# Patient Record
Sex: Female | Born: 1955 | Race: White | Hispanic: No | Marital: Married | Smoking: Never smoker
Health system: Southern US, Community
[De-identification: ages and names within clinical notes are randomized; demographics above are authoritative.]

## PROBLEM LIST (undated history)

## (undated) DIAGNOSIS — C7A8 Other malignant neuroendocrine tumors: Secondary | ICD-10-CM

## (undated) DIAGNOSIS — R1310 Dysphagia, unspecified: Secondary | ICD-10-CM

## (undated) DIAGNOSIS — G250 Essential tremor: Secondary | ICD-10-CM

## (undated) DIAGNOSIS — N809 Endometriosis, unspecified: Secondary | ICD-10-CM

## (undated) DIAGNOSIS — M8580 Other specified disorders of bone density and structure, unspecified site: Secondary | ICD-10-CM

## (undated) DIAGNOSIS — E785 Hyperlipidemia, unspecified: Secondary | ICD-10-CM

## (undated) DIAGNOSIS — M810 Age-related osteoporosis without current pathological fracture: Secondary | ICD-10-CM

## (undated) DIAGNOSIS — I10 Essential (primary) hypertension: Secondary | ICD-10-CM

## (undated) DIAGNOSIS — F32A Depression, unspecified: Secondary | ICD-10-CM

## (undated) DIAGNOSIS — Z9189 Other specified personal risk factors, not elsewhere classified: Secondary | ICD-10-CM

## (undated) DIAGNOSIS — R7303 Prediabetes: Secondary | ICD-10-CM

## (undated) DIAGNOSIS — M1990 Unspecified osteoarthritis, unspecified site: Secondary | ICD-10-CM

## (undated) DIAGNOSIS — E669 Obesity, unspecified: Secondary | ICD-10-CM

## (undated) DIAGNOSIS — E119 Type 2 diabetes mellitus without complications: Secondary | ICD-10-CM

## (undated) DIAGNOSIS — Z91B Personal risk factor of exposure to diethylstilbestrol: Secondary | ICD-10-CM

## (undated) DIAGNOSIS — K5730 Diverticulosis of large intestine without perforation or abscess without bleeding: Secondary | ICD-10-CM

## (undated) DIAGNOSIS — T7840XA Allergy, unspecified, initial encounter: Secondary | ICD-10-CM

## (undated) DIAGNOSIS — K219 Gastro-esophageal reflux disease without esophagitis: Secondary | ICD-10-CM

## (undated) HISTORY — DX: Endometriosis, unspecified: N80.9

## (undated) HISTORY — DX: Other malignant neuroendocrine tumors: C7A.8

## (undated) HISTORY — DX: Allergy, unspecified, initial encounter: T78.40XA

## (undated) HISTORY — DX: Essential (primary) hypertension: I10

## (undated) HISTORY — DX: Obesity, unspecified: E66.9

## (undated) HISTORY — DX: Diverticulosis of large intestine without perforation or abscess without bleeding: K57.30

## (undated) HISTORY — DX: Age-related osteoporosis without current pathological fracture: M81.0

## (undated) HISTORY — DX: Essential tremor: G25.0

## (undated) HISTORY — DX: Dysphagia, unspecified: R13.10

## (undated) HISTORY — DX: Type 2 diabetes mellitus without complications: E11.9

## (undated) HISTORY — DX: Gastro-esophageal reflux disease without esophagitis: K21.9

## (undated) HISTORY — DX: Other specified disorders of bone density and structure, unspecified site: M85.80

## (undated) HISTORY — PX: OOPHORECTOMY: SHX86

## (undated) HISTORY — DX: Unspecified osteoarthritis, unspecified site: M19.90

## (undated) HISTORY — DX: Prediabetes: R73.03

## (undated) HISTORY — DX: Hyperlipidemia, unspecified: E78.5

## (undated) HISTORY — DX: Depression, unspecified: F32.A

## (undated) HISTORY — PX: BUNIONECTOMY: SHX129

## (undated) HISTORY — DX: Personal risk factor of exposure to diethylstilbestrol: Z91.B

## (undated) HISTORY — DX: Other specified personal risk factors, not elsewhere classified: Z91.89

---

## 2005-07-15 DIAGNOSIS — D3A00 Benign carcinoid tumor of unspecified site: Secondary | ICD-10-CM

## 2005-07-15 DIAGNOSIS — D3A019 Benign carcinoid tumor of the small intestine, unspecified portion: Secondary | ICD-10-CM

## 2005-07-15 HISTORY — DX: Benign carcinoid tumor of the small intestine, unspecified portion: D3A.019

## 2005-07-15 HISTORY — PX: TOTAL ABDOMINAL HYSTERECTOMY: SHX209

## 2005-07-15 HISTORY — DX: Benign carcinoid tumor of unspecified site: D3A.00

## 2005-11-12 HISTORY — PX: APPENDECTOMY: SHX54

## 2017-05-15 DIAGNOSIS — Z860101 Personal history of adenomatous and serrated colon polyps: Secondary | ICD-10-CM

## 2017-05-15 DIAGNOSIS — Z86010 Personal history of colonic polyps: Secondary | ICD-10-CM

## 2017-05-15 HISTORY — DX: Personal history of adenomatous and serrated colon polyps: Z86.0101

## 2017-05-15 HISTORY — DX: Personal history of colonic polyps: Z86.010

## 2019-08-05 ENCOUNTER — Ambulatory Visit: Payer: Medicare Other | Attending: Internal Medicine

## 2019-08-05 DIAGNOSIS — Z23 Encounter for immunization: Secondary | ICD-10-CM

## 2019-08-05 NOTE — Progress Notes (Signed)
   Covid-19 Vaccination Clinic  Name:  Lauren [name]    MRN: [id number] DOB: [birth year]  08/05/2019  Lauren [name] was observed post Covid-19 immunization for 15 minutes without incidence. She was provided with Vaccine Information Sheet and instruction to access the V-Safe system.   Lauren [name] was instructed to call 911 with any severe reactions post vaccine: Marland Kitchen Difficulty breathing  . Swelling of your face and throat  . A fast heartbeat  . A bad rash all over your body  . Dizziness and weakness    Immunizations Administered    Name Date Dose VIS Date Route   Pfizer COVID-19 Vaccine 08/05/2019 10:45 AM 0.3 mL 06/25/2019 Intramuscular   Manufacturer: [place]   Lot: [id number]   [place]: [id number]

## 2019-08-26 ENCOUNTER — Ambulatory Visit: Payer: Medicare Other | Attending: Internal Medicine

## 2019-08-26 DIAGNOSIS — Z23 Encounter for immunization: Secondary | ICD-10-CM | POA: Insufficient documentation

## 2019-08-26 NOTE — Progress Notes (Signed)
   Covid-19 Vaccination Clinic  Name:  Lauren [name]    MRN: [id number] DOB: Dec 26, 1955  08/26/2019  Lauren [name] was observed post Covid-19 immunization for 15 minutes without incidence. She was provided with Vaccine Information Sheet and instruction to access the V-Safe system.   Lauren [name] was instructed to call 911 with any severe reactions post vaccine: Marland Kitchen Difficulty breathing  . Swelling of your face and throat  . A fast heartbeat  . A bad rash all over your body  . Dizziness and weakness    Immunizations Administered    Name Date Dose VIS Date Route   Pfizer COVID-19 Vaccine 08/26/2019 11:24 AM 0.3 mL 06/25/2019 Intramuscular   Manufacturer: [place]   Lot: [id number]   [place]: [id number]

## 2019-09-07 ENCOUNTER — Ambulatory Visit: Payer: Self-pay

## 2019-12-31 ENCOUNTER — Encounter: Payer: Self-pay | Admitting: Cardiology

## 2019-12-31 ENCOUNTER — Ambulatory Visit: Payer: Medicare Other | Admitting: Cardiology

## 2019-12-31 ENCOUNTER — Other Ambulatory Visit: Payer: Self-pay

## 2019-12-31 VITALS — BP 125/87 | HR 96 | Temp 97.0°F | Resp 14 | Ht 65.0 in | Wt 203.4 lb

## 2019-12-31 DIAGNOSIS — E782 Mixed hyperlipidemia: Secondary | ICD-10-CM

## 2019-12-31 DIAGNOSIS — Z6833 Body mass index (BMI) 33.0-33.9, adult: Secondary | ICD-10-CM

## 2019-12-31 DIAGNOSIS — R030 Elevated blood-pressure reading, without diagnosis of hypertension: Secondary | ICD-10-CM

## 2019-12-31 DIAGNOSIS — R7303 Prediabetes: Secondary | ICD-10-CM

## 2019-12-31 DIAGNOSIS — R002 Palpitations: Secondary | ICD-10-CM

## 2019-12-31 DIAGNOSIS — E6609 Other obesity due to excess calories: Secondary | ICD-10-CM

## 2019-12-31 DIAGNOSIS — R0602 Shortness of breath: Secondary | ICD-10-CM

## 2019-12-31 NOTE — Progress Notes (Signed)
Date:  12/31/2019   ID:  Lauren [name], [place] 28-Nov-1955, MRN [id number]  PCP:  Kathee Polite, MD  Cardiologist:  Rex Kras, DO, [facility] (established care 12/31/2019)  REASON FOR CONSULT: Shortness of Breath  REQUESTING PHYSICIAN:  Lynnae Prude, [place] [address] [place],  [place] [address]  Chief Complaint  Patient presents with  . Shortness of Breath  . New Patient (Initial Visit)    Referred by Lynnae Prude    HPI  Lauren [name] is a 64 y.o. female who presents to the office with a chief complaint of " shortness of breath, elevated blood pressures, elevated heart rate." She is referred to the office at the request of Lynnae Prude, [place]. Patient's past medical history and cardiovascular risk factors include: Prediabetes, mixed hyperlipidemia, obesity due to excess calories, postmenopausal female, advanced age.  Patient presents to the office with multiple chief complaint as noted above.  Patient states her shortness of breath started in May 2020 after she started working out at [employer].  The symptoms are usually brought on with effort related activities and improves with resting.  Associated with lower extremity swelling.  She denies orthopnea or paroxysmal nocturnal dyspnea.  No recent prolonged immobilization or recent surgeries.  No prior history of DVT or PE.  Patient states that she used to take Lasix on a as needed basis.  However, recently she has been taking it regularly.  proBNP within normal limits.  Patient denies any chest pain at rest or with effort related activities.  Palpations: Ongoing for approximately 1 year but notes that recently it has been getting progressive.  Using intermittent lasting for 5 minutes and occurs daily.  Associated with lightheadedness but no dizziness or syncope.  At times when she checks her blood pressures at home patient states that they have been elevated.  She did not have a log of her blood pressures for me to review at  today's office visit.  Office blood pressure currently well controlled.   Denies prior history of coronary artery disease, myocardial infarction, congestive heart failure, deep venous thrombosis, pulmonary embolism, stroke, transient ischemic attack.  ALLERGIES: Allergies  Allergen Reactions  . Codeine Itching  . Sulfa Antibiotics Diarrhea and Nausea And Vomiting    MEDICATION LIST PRIOR TO VISIT: Current Meds  Medication Sig  . buPROPion (WELLBUTRIN XL) 150 MG 24 hr tablet Take 1 tablet by mouth daily.  . cetirizine (ZYRTEC) 10 MG tablet Take 1 tablet by mouth at bedtime.  . Cholecalciferol 25 MCG (1000 UT) capsule Take 1 capsule by mouth daily.  . furosemide (LASIX) 20 MG tablet Take 1 tablet by mouth as needed.  . Magnesium (V-R MAGNESIUM) 250 MG TABS Take 1 tablet by mouth daily.  . metFORMIN (GLUCOPHAGE-XR) 500 MG 24 hr tablet Take 2 tablets by mouth daily.     PAST MEDICAL HISTORY: Past Medical History:  Diagnosis Date  . Carcinoid tumor 2007  . Depression   . Hyperlipidemia   . Obesity   . Prediabetes     PAST SURGICAL HISTORY: Past Surgical History:  Procedure Laterality Date  . TOTAL ABDOMINAL HYSTERECTOMY  2007    FAMILY HISTORY: The patient family history includes Bone cancer in her father; Brain cancer in her father; Lung cancer in her mother.  SOCIAL HISTORY:  The patient  reports that she has never smoked. She has never used smokeless tobacco. She reports that she does not drink alcohol and does not use drugs.  REVIEW OF SYSTEMS:  Review of Systems  Constitutional: Positive for malaise/fatigue. Negative for chills and fever.  HENT: Negative for hoarse voice and nosebleeds.   Eyes: Negative for discharge, double vision and pain.  Cardiovascular: Positive for dyspnea on exertion, leg swelling and palpitations. Negative for chest pain, claudication, near-syncope, orthopnea, paroxysmal nocturnal dyspnea and syncope.  Respiratory: Positive for shortness of  breath. Negative for hemoptysis.   Musculoskeletal: Negative for muscle cramps and myalgias.  Gastrointestinal: Negative for abdominal pain, constipation, diarrhea, hematemesis, hematochezia, melena, nausea and vomiting.  Neurological: Negative for dizziness and light-headedness.    PHYSICAL EXAM: Vitals with BMI 12/31/2019  Height '5\' 5"'   Weight 203 lbs 6 oz  BMI 03.21  Systolic 224  Diastolic 87  Pulse 96   CONSTITUTIONAL: Well-developed and well-nourished. No acute distress.  SKIN: Skin is warm and dry. No rash noted. No cyanosis. No pallor. No jaundice HEAD: Normocephalic and atraumatic.  EYES: No scleral icterus MOUTH/THROAT: Moist oral membranes.  NECK: No JVD present. No thyromegaly noted. No carotid bruits  LYMPHATIC: No visible cervical adenopathy.  CHEST Normal respiratory effort. No intercostal retractions  LUNGS: Clear to auscultation bilaterally.  No stridor. No wheezes. No rales.  CARDIOVASCULAR: Regular rate and rhythm, positive M2-N0, soft holosystolic murmur heard at apex, no gallops or rubs. ABDOMINAL: No apparent ascites.  EXTREMITIES: No peripheral edema  HEMATOLOGIC: No significant bruising NEUROLOGIC: Oriented to person, place, and time. Nonfocal. Normal muscle tone.  PSYCHIATRIC: Normal mood and affect. Normal behavior. Cooperative  CARDIAC DATABASE: EKG: 12/31/2019: Normal sinus rhythm, 92 bpm, normal axis, consider old inferior infarct, pattern.  Echocardiogram: none  Stress Testing: None  Heart Catheterization: None  LABORATORY DATA:  External Labs: Collected: 12/30/2019 Hemoglobin 14.6 g/dL Creatinine 0.73 mg/dL. eGFR: 86 mL/min per 1.73 m Potassium: 4.2 Lipid profile: Total cholesterol 276, triglycerides 180, HDL 74, LDL 167 TSH: 3.485  ProBNP: 18pg/mL  IMPRESSION:    ICD-10-CM   1. Shortness of breath  R06.02 EKG 12-Lead    CT CARDIAC SCORING    PCV ECHOCARDIOGRAM COMPLETE    PCV MYOCARDIAL PERFUSION WITH LEXISCAN  2. Blood  pressure elevated without history of HTN  R03.0   3. Palpitations  R00.2 HOLTER MONITOR - 24 HOUR  4. Class 1 obesity due to excess calories without serious comorbidity with body mass index (BMI) of 33.0 to 33.9 in adult  E66.09    Z68.33   5. Prediabetes  R73.03 CT CARDIAC SCORING  6. Mixed hyperlipidemia  E78.2 CT CARDIAC SCORING     RECOMMENDATIONS: Izabel Chim is a 64 y.o. female whose past medical history and cardiac risk factors include: Prediabetes, mixed hyperlipidemia, obesity due to excess calories, postmenopausal female, advanced age.  Shortness of breath:  Plan 24-hour Holter monitor to evaluate underlying arrhythmic burden.  Echocardiogram will be ordered to evaluate for structural heart disease and left ventricular systolic function.  Nuclear stress test recommended to evaluate for reversible ischemia.  EKG reviewed.  Clinically patient is euvolemic and not in congestive heart failure.  Elevated blood pressures without history of hypertension: Patient states that her blood pressures are elevated at home.  She recommended to keep a log of her blood pressures and to bring it in at the next office visit.  However, if her systolic blood pressures are consistently greater than 140 mmHg she is asked to call the office sooner than her next appointment for medication titration.  Low-salt diet recommended.  Recommended moderate intensity exercise 30 minutes a day 5 days a week  Palpitations:  TSH levels reviewed, mildly above the normal limit.  Patient is asked discuss it further with her primary care provider.  24-hour Holter monitor to evaluate for underlying arrhythmic burden/atrial fibrillation.  Prediabetes and mixed hyperlipidemia:  Educated on importance of risk factor modifications given her modifiable cardiovascular risk factors.  Patient's estimated 10-year risk of ASCVD is approximately 6.2% as per the available information.  Coronary calcification score  for further risk stratification.  Patient hesitant to start statin therapy for now.  Recommended moderate intensity exercise 30 minutes a day 5 days a week.   Independently reviewed outside records that she brought in today's office visit.  FINAL MEDICATION LIST END OF ENCOUNTER: No orders of the defined types were placed in this encounter.   There are no discontinued medications.   Current Outpatient Medications:  .  buPROPion (WELLBUTRIN XL) 150 MG 24 hr tablet, Take 1 tablet by mouth daily., Disp: , Rfl:  .  cetirizine (ZYRTEC) 10 MG tablet, Take 1 tablet by mouth at bedtime., Disp: , Rfl:  .  Cholecalciferol 25 MCG (1000 UT) capsule, Take 1 capsule by mouth daily., Disp: , Rfl:  .  furosemide (LASIX) 20 MG tablet, Take 1 tablet by mouth as needed., Disp: , Rfl:  .  Magnesium (V-R MAGNESIUM) 250 MG TABS, Take 1 tablet by mouth daily., Disp: , Rfl:  .  metFORMIN (GLUCOPHAGE-XR) 500 MG 24 hr tablet, Take 2 tablets by mouth daily., Disp: , Rfl:   Orders Placed This Encounter  Procedures  . CT CARDIAC SCORING  . PCV MYOCARDIAL PERFUSION WITH LEXISCAN  . HOLTER MONITOR - 24 HOUR  . EKG 12-Lead  . PCV ECHOCARDIOGRAM COMPLETE    There are no Patient Instructions on file for this visit.   --Continue cardiac medications as reconciled in final medication list. --Return in about 4 weeks (around 01/28/2020) for re-evaluation of symptoms shortness of breath and review test results . Or sooner if needed. --Continue follow-up with your primary care physician regarding the management of your other chronic comorbid conditions.  Patient's questions and concerns were addressed to her satisfaction. She voices understanding of the instructions provided during this encounter.   This note was created using a voice recognition software as a result there may be grammatical errors inadvertently enclosed that do not reflect the nature of this encounter. Every attempt is made to correct such  errors.  Rex Kras, [place], [facility]  Pager: [phone] Office: [phone]

## 2020-01-06 ENCOUNTER — Ambulatory Visit: Payer: Medicare Other

## 2020-01-06 ENCOUNTER — Other Ambulatory Visit: Payer: Self-pay

## 2020-01-06 DIAGNOSIS — R0602 Shortness of breath: Secondary | ICD-10-CM

## 2020-01-11 ENCOUNTER — Other Ambulatory Visit: Payer: Self-pay

## 2020-01-11 ENCOUNTER — Ambulatory Visit: Payer: Medicare Other

## 2020-01-14 ENCOUNTER — Telehealth: Payer: Self-pay

## 2020-01-14 NOTE — Telephone Encounter (Signed)
Called pt and is aware of her results and inform her that it is ok for her to go out of town

## 2020-01-14 NOTE — Telephone Encounter (Signed)
Pt called and asking if she is able to go home due that her mother has passed away.

## 2020-01-14 NOTE — Telephone Encounter (Signed)
Echocardiogram results noted preserved LVEF.  Details will be reviewed at the next office visit.  And coronary calcium score is 0 and other additional details will be reviewed at the next office visit.

## 2020-01-14 NOTE — Progress Notes (Signed)
Coronary artery calcification scoring performed on 01/05/2020 at [facility]: Total calcium score: 0 AU Incidental finding of a 29 mm left lobe liver cyst.  Will inform the patient at next office visit.  Will defer additional work-up to her primary care provider.

## 2020-01-24 ENCOUNTER — Other Ambulatory Visit: Payer: Self-pay

## 2020-01-24 ENCOUNTER — Ambulatory Visit: Payer: Medicare Other

## 2020-01-24 DIAGNOSIS — R0602 Shortness of breath: Secondary | ICD-10-CM

## 2020-02-01 ENCOUNTER — Encounter: Payer: Self-pay | Admitting: Cardiology

## 2020-02-01 ENCOUNTER — Other Ambulatory Visit: Payer: Self-pay

## 2020-02-01 ENCOUNTER — Ambulatory Visit: Payer: Medicare Other | Admitting: Cardiology

## 2020-02-01 VITALS — BP 128/96 | HR 95 | Ht 65.0 in | Wt 202.0 lb

## 2020-02-01 DIAGNOSIS — R0609 Other forms of dyspnea: Secondary | ICD-10-CM

## 2020-02-01 DIAGNOSIS — I10 Essential (primary) hypertension: Secondary | ICD-10-CM

## 2020-02-01 DIAGNOSIS — Z712 Person consulting for explanation of examination or test findings: Secondary | ICD-10-CM

## 2020-02-01 DIAGNOSIS — R7303 Prediabetes: Secondary | ICD-10-CM

## 2020-02-01 DIAGNOSIS — E6609 Other obesity due to excess calories: Secondary | ICD-10-CM

## 2020-02-01 DIAGNOSIS — R0600 Dyspnea, unspecified: Secondary | ICD-10-CM

## 2020-02-01 DIAGNOSIS — Z6833 Body mass index (BMI) 33.0-33.9, adult: Secondary | ICD-10-CM

## 2020-02-01 DIAGNOSIS — R002 Palpitations: Secondary | ICD-10-CM

## 2020-02-01 MED ORDER — DILTIAZEM HCL ER COATED BEADS 120 MG PO CP24: 120.0000 mg | ORAL_CAPSULE | Freq: Every morning | ORAL | 0 refills | Status: DC

## 2020-02-01 NOTE — Progress Notes (Signed)
Date:  02/01/2020   ID:  Lauren [name], Lauren [name] 1956/04/25, MRN [id number]  PCP:  Kathee Polite, MD  Cardiologist:  Rex Kras, DO, [facility] (established care 12/31/2019)  REASON FOR CONSULT: Shortness of Breath  REQUESTING PHYSICIAN:  Kathee Polite, MD [address],  [place] [address]  Chief Complaint  Patient presents with  . Shortness of Breath  . Follow-up  . Palpitations    HPI  Lauren [name] is a 64 y.o. female who presents to the office with a chief complaint of " reevaluation of shortness of breath/palpitations and review test results." Patient's past medical history and cardiovascular risk factors include: Prediabetes, mixed hyperlipidemia, obesity due to excess calories, postmenopausal female, advanced age.  Patient is referred to the office at the request of her management of shortness of breath back in June 2021.  At the last office visit she was having symptoms of dyspnea on exertion as well as palpitation. She was recommended to undergo echo, exercise stress test, and coronary calcification score for risk stratification given her lipid profile. She was also asked to keep a log off her BP and pulse.   Patient is informed that her echocardiogram noted preserved left ventricular systolic function with mild valvular heart disease.  Reviewed the results of her stress test which noted normal myocardial perfusion suggestive of a low risk study.  Her coronary artery calcification score was also zero.  Her monitor results noted no significant dysrhythmias.   She was made aware of the noncardiac findings of a 29 mm left lobe liver cyst that was noted on the coronary calcification study.  Patient states that she is familiar with the fact she has liver cysts and this is most likely secondary to her history of carcinoid tumor which is being followed by oncology.  In addition, patient did bring in her blood pressure log to review.  Her home systolic  blood pressures have consistently been greater than 120 mmHg and her diastolic blood pressures are around 80 mmHg.  Her pulse at home range between 990-110 bpm.    Monitor results did not show any significant dysrhythmias at her baseline average heart rate is approximately 91 bpm.  Denies prior history of coronary artery disease, myocardial infarction, congestive heart failure, deep venous thrombosis, pulmonary embolism, stroke, transient ischemic attack.  ALLERGIES: Allergies  Allergen Reactions  . Codeine Itching  . Sulfa Antibiotics Diarrhea and Nausea And Vomiting    MEDICATION LIST PRIOR TO VISIT: Current Meds  Medication Sig  . buPROPion (WELLBUTRIN XL) 150 MG 24 hr tablet Take 1 tablet by mouth daily.  . cetirizine (ZYRTEC) 10 MG tablet Take 1 tablet by mouth at bedtime.  . Cholecalciferol 25 MCG (1000 UT) capsule Take 1 capsule by mouth daily.  . furosemide (LASIX) 20 MG tablet Take 1 tablet by mouth as needed.  . Magnesium (V-R MAGNESIUM) 250 MG TABS Take 1 tablet by mouth daily.  . metFORMIN (GLUCOPHAGE-XR) 500 MG 24 hr tablet Take 2 tablets by mouth daily.  . Probiotic Product (PRO-BIOTIC BLEND) CAPS Take 1 capsule by mouth in the morning and at bedtime.     PAST MEDICAL HISTORY: Past Medical History:  Diagnosis Date  . Carcinoid tumor 2007  . Depression   . Hyperlipidemia   . Obesity   . Prediabetes     PAST SURGICAL HISTORY: Past Surgical History:  Procedure Laterality Date  . TOTAL ABDOMINAL HYSTERECTOMY  2007    FAMILY HISTORY: The patient  family history includes Bone cancer in her father; Brain cancer in her father; Lung cancer in her mother.  SOCIAL HISTORY:  The patient  reports that she has never smoked. She has never used smokeless tobacco. She reports that she does not drink alcohol and does not use drugs.  REVIEW OF SYSTEMS: Review of Systems  Constitutional: Positive for malaise/fatigue. Negative for chills and fever.  HENT: Negative for hoarse  voice and nosebleeds.   Eyes: Negative for discharge, double vision and pain.  Cardiovascular: Positive for dyspnea on exertion, leg swelling and palpitations. Negative for chest pain, claudication, near-syncope, orthopnea, paroxysmal nocturnal dyspnea and syncope.  Respiratory: Positive for shortness of breath. Negative for hemoptysis.   Musculoskeletal: Negative for muscle cramps and myalgias.  Gastrointestinal: Negative for abdominal pain, constipation, diarrhea, hematemesis, hematochezia, melena, nausea and vomiting.  Neurological: Negative for dizziness and light-headedness.    PHYSICAL EXAM: Vitals with BMI 02/01/2020 12/31/2019  Height '5\' 5"'  '5\' 5"'   Weight 202 lbs 203 lbs 6 oz  BMI 38.18 29.93  Systolic 716 967  Diastolic 96 87  Pulse 95 96   CONSTITUTIONAL: Well-developed and well-nourished. No acute distress.  SKIN: Skin is warm and dry. No rash noted. No cyanosis. No pallor. No jaundice HEAD: Normocephalic and atraumatic.  EYES: No scleral icterus MOUTH/THROAT: Moist oral membranes.  NECK: No JVD present. No thyromegaly noted. No carotid bruits  LYMPHATIC: No visible cervical adenopathy.  CHEST Normal respiratory effort. No intercostal retractions  LUNGS: Clear to auscultation bilaterally.  No stridor. No wheezes. No rales.  CARDIOVASCULAR: Regular rate and rhythm, positive E9-F8, soft holosystolic murmur heard at apex, no gallops or rubs. ABDOMINAL: No apparent ascites.  EXTREMITIES: No peripheral edema  HEMATOLOGIC: No significant bruising NEUROLOGIC: Oriented to person, place, and time. Nonfocal. Normal muscle tone.  PSYCHIATRIC: Normal mood and affect. Normal behavior. Cooperative  CARDIAC DATABASE: EKG: 12/31/2019: Normal sinus rhythm, 92 bpm, normal axis, consider old inferior infarct, pattern.  Echocardiogram: 01/06/2020:  Normal LV systolic function with visual EF 55-60%. Left ventricle cavity is normal in size. Mild left ventricular hypertrophy. Normal global  wall motion. Indeterminate diastolic filling pattern, normal LAP. Calculated EF 55%.  Left atrial cavity is mildly dilated.  Mild (Grade I) mitral regurgitation.  IVC is dilated with a respiratory response of <50%.  No prior study for comparison.   Stress Testing: Exercise Myoview stress test 01/24/2020:  Exercise nuclear stress test was performed using Bruce protocol. Patient reached 7 METS, and 96% of age predicted maximum heart rate. Exercise capacity was low. Chest pain not reported. Heart rate and hemodynamic response were normal. Stress EKG revealed no ischemic changes.  Normal myocardial perfusion. Stress LVEF 67%. Low risk study,  Coronary artery calcification scoring performed on 01/05/2020 at [facility]:  Total calcium score: 0 AU  Incidental finding of a 29 mm left lobe liver cyst. Will inform the patient at next office visit. Will defer additional work-up to her primary care provider.  Heart Catheterization: None  LABORATORY DATA:  External Labs: Collected: 12/30/2019 Hemoglobin 14.6 g/dL Creatinine 0.73 mg/dL. eGFR: 86 mL/min per 1.73 m Potassium: 4.2 Lipid profile: Total cholesterol 276, triglycerides 180, HDL 74, LDL 167 TSH: 3.485  ProBNP: 18pg/mL  IMPRESSION:    ICD-10-CM   1. Palpitations  R00.2 diltiazem (CARDIZEM CD) 120 MG 24 hr capsule  2. Encounter to discuss test results  Z71.2   3. Benign hypertension  I10   4. Dyspnea on exertion  R06.00   5. Prediabetes  R73.03  6. Class 1 obesity due to excess calories without serious comorbidity with body mass index (BMI) of 33.0 to 33.9 in adult  E66.09    Z68.33      RECOMMENDATIONS: Ermina Oberman is a 64 y.o. female whose past medical history and cardiac risk factors include: Prediabetes, mixed hyperlipidemia, obesity due to excess calories, postmenopausal female, advanced age.  Dyspnea on exertion: Chronic and stable  Given her symptoms of dyspnea on exertion patient underwent a comprehensive  cardiovascular work-up including but not limited to echo, stress test, coronary artery calcification scoring, and Holter monitor.    Results were discussed with her the risk mentioned..    Patient's blood pressure is not well controlled which may also be contributing to her dyspnea on exertion.  Patient states that she has an appointment to see her primary care provider later today who will address her blood pressures.    Continue to monitor for now.    Benign essential hypertension: After reviewing her home blood pressure logs patient does have benign essential hypertension.  Recommended antihypertensive medications which she will discuss with her PCP later today.  Palpitations:  TSH levels reviewed, mildly above the normal limit.  Patient is asked discuss it further with her primary care provider.  24-hour Holter monitor reviewed with the patient.  Final report forthcoming  Given his symptoms of palpitations without any significant underlying arrhythmia and her baseline pulse being elevated recommended trial pharmacological therapy.  Start Cardizem 120 mg p.o. daily  We will reevaluate.  Prediabetes and mixed hyperlipidemia:  Educated on importance of risk factor modifications given her modifiable cardiovascular risk factors.  Patient's estimated 10-year risk of ASCVD is approximately 6.2% as per the available information.  Coronary calcification score is zero.   Recommended moderate intensity exercise 30 minutes a day 5 days a week.  With close follow-up regarding lipid management with her PCP as her last LDL was 167 mg/dL.  Patient educated on importance of improving her cardiovascular risk factors for primary prevention of CAD.  Patient is informed of the noncardiac findings noted on coronary CTA study.  Patient states that she has history of carcinoid tumors and liver cyst are not a new finding.  Both of them are currently being managed by her oncology, per patient  FINAL  MEDICATION LIST END OF ENCOUNTER: Meds ordered this encounter  Medications  . diltiazem (CARDIZEM CD) 120 MG 24 hr capsule    Sig: Take 1 capsule (120 mg total) by mouth in the morning.    Dispense:  90 capsule    Refill:  0    There are no discontinued medications.   Current Outpatient Medications:  .  buPROPion (WELLBUTRIN XL) 150 MG 24 hr tablet, Take 1 tablet by mouth daily., Disp: , Rfl:  .  cetirizine (ZYRTEC) 10 MG tablet, Take 1 tablet by mouth at bedtime., Disp: , Rfl:  .  Cholecalciferol 25 MCG (1000 UT) capsule, Take 1 capsule by mouth daily., Disp: , Rfl:  .  furosemide (LASIX) 20 MG tablet, Take 1 tablet by mouth as needed., Disp: , Rfl:  .  Magnesium (V-R MAGNESIUM) 250 MG TABS, Take 1 tablet by mouth daily., Disp: , Rfl:  .  metFORMIN (GLUCOPHAGE-XR) 500 MG 24 hr tablet, Take 2 tablets by mouth daily., Disp: , Rfl:  .  Probiotic Product (PRO-BIOTIC BLEND) CAPS, Take 1 capsule by mouth in the morning and at bedtime., Disp: , Rfl:  .  diltiazem (CARDIZEM CD) 120 MG 24 hr capsule,  Take 1 capsule (120 mg total) by mouth in the morning., Disp: 90 capsule, Rfl: 0  No orders of the defined types were placed in this encounter.   There are no Patient Instructions on file for this visit.   --Continue cardiac medications as reconciled in final medication list. --Return in about 6 months (around 08/03/2020). Or sooner if needed. --Continue follow-up with your primary care physician regarding the management of your other chronic comorbid conditions.  Patient's questions and concerns were addressed to her satisfaction. She voices understanding of the instructions provided during this encounter.   This note was created using a voice recognition software as a result there may be grammatical errors inadvertently enclosed that do not reflect the nature of this encounter. Every attempt is made to correct such errors.  Total time spent: 40 minutes.  Rex Kras, [place], [facility]  Pager:  [phone] Office: [phone]

## 2020-04-28 ENCOUNTER — Other Ambulatory Visit: Payer: Self-pay | Admitting: Cardiology

## 2020-04-28 DIAGNOSIS — R002 Palpitations: Secondary | ICD-10-CM

## 2020-08-03 ENCOUNTER — Ambulatory Visit: Payer: Medicare Other | Admitting: Cardiology

## 2020-08-24 ENCOUNTER — Other Ambulatory Visit: Payer: Self-pay

## 2020-08-24 ENCOUNTER — Encounter: Payer: Self-pay | Admitting: Cardiology

## 2020-08-24 ENCOUNTER — Ambulatory Visit: Payer: Medicare Other | Admitting: Cardiology

## 2020-08-24 VITALS — BP 123/80 | HR 99 | Temp 98.0°F | Resp 16 | Ht 65.0 in | Wt 200.4 lb

## 2020-08-24 DIAGNOSIS — E7800 Pure hypercholesterolemia, unspecified: Secondary | ICD-10-CM

## 2020-08-24 DIAGNOSIS — I10 Essential (primary) hypertension: Secondary | ICD-10-CM

## 2020-08-24 DIAGNOSIS — R002 Palpitations: Secondary | ICD-10-CM

## 2020-08-24 DIAGNOSIS — E6609 Other obesity due to excess calories: Secondary | ICD-10-CM

## 2020-08-24 DIAGNOSIS — R7303 Prediabetes: Secondary | ICD-10-CM

## 2020-08-24 DIAGNOSIS — Z6833 Body mass index (BMI) 33.0-33.9, adult: Secondary | ICD-10-CM

## 2020-08-24 NOTE — Progress Notes (Signed)
ID:  Kalan, Rinn 12-24-1955, MRN [id number]  PCP:  Michael Boston, MD  Cardiologist:  Rex Kras, DO, [facility] (established care 12/31/2019)  Date: 08/27/20 Last Office Visit: 02/01/2020  Chief Complaint  Patient presents with  . Palpitations  . Follow-up    HPI  Lauren [name] is a 65 y.o. female who presents to the office with a chief complaint of " 6 month reevaluation of shortness of breath/palpitations." Patient's past medical history and cardiovascular risk factors include: Prediabetes, mixed hyperlipidemia, obesity due to excess calories, postmenopausal female, advanced age.  Patient is referred to the office at the request of her management of shortness of breath back in June 2021.  Patient has undergone extensive cardiovascular work-up including an echocardiogram, coronary artery calcium scoring, and stress test.  Results are noted below for further reference.  Since last office visit patient states that she is doing well from a cardiovascular standpoint.  She is here for 57-monthfollow-up. She no longer has chest pain or shortness of breath.  Her palpitations have also resolved.  She is tolerating the calcium channel blockers well without any side effects or intolerances.  Patient states that her home blood pressure ranges between 1654-650mmHg and diastolic blood pressures are around 60 mmHg.  ALLERGIES: Allergies  Allergen Reactions  . Codeine Itching  . Sulfa Antibiotics Diarrhea and Nausea And Vomiting    MEDICATION LIST PRIOR TO VISIT: Current Meds  Medication Sig  . buPROPion (WELLBUTRIN XL) 300 MG 24 hr tablet Take 300 mg by mouth daily.  .Marland KitchenCARTIA XT 120 MG 24 hr capsule TAKE ONE CAPSULE BY MOUTH EVERY MORNING  . cetirizine (ZYRTEC) 10 MG tablet Take 1 tablet by mouth at bedtime.  . Cholecalciferol 25 MCG (1000 UT) capsule Take 1 capsule by mouth daily.  . furosemide (LASIX) 20 MG tablet Take 1 tablet by mouth as needed.  . Magnesium 250 MG TABS Take  1 tablet by mouth daily.  . metFORMIN (GLUCOPHAGE-XR) 500 MG 24 hr tablet Take 2 tablets by mouth daily.     PAST MEDICAL HISTORY: Past Medical History:  Diagnosis Date  . Carcinoid tumor 2007  . Depression   . Hyperlipidemia   . Hypertension   . Obesity   . Prediabetes     PAST SURGICAL HISTORY: Past Surgical History:  Procedure Laterality Date  . TOTAL ABDOMINAL HYSTERECTOMY  2007    FAMILY HISTORY: The patient family history includes Bone cancer in her father; Brain cancer in her father; Lung cancer in her mother.  SOCIAL HISTORY:  The patient  reports that she has never smoked. She has never used smokeless tobacco. She reports that she does not drink alcohol and does not use drugs.  REVIEW OF SYSTEMS: Review of Systems  Constitutional: Negative for chills, fever and malaise/fatigue.  HENT: Negative for hoarse voice and nosebleeds.   Eyes: Negative for discharge, double vision and pain.  Cardiovascular: Positive for leg swelling (chronic and stable. ). Negative for chest pain, claudication, dyspnea on exertion, near-syncope, orthopnea, palpitations, paroxysmal nocturnal dyspnea and syncope.  Respiratory: Negative for hemoptysis and shortness of breath.   Musculoskeletal: Negative for muscle cramps and myalgias.  Gastrointestinal: Negative for abdominal pain, constipation, diarrhea, hematemesis, hematochezia, melena, nausea and vomiting.  Neurological: Negative for dizziness and light-headedness.    PHYSICAL EXAM: Vitals with BMI 08/24/2020 02/01/2020 12/31/2019  Height _0  _1  _2   Weight 200 lbs 6 oz 202 lbs 203 lbs 6 oz  BMI 33.35  14.43 15.40  Systolic 086 761 950  Diastolic 80 96 87  Pulse 99 95 96   CONSTITUTIONAL: Well-developed and well-nourished. No acute distress.  SKIN: Skin is warm and dry. No rash noted. No cyanosis. No pallor. No jaundice HEAD: Normocephalic and atraumatic.  EYES: No scleral icterus MOUTH/THROAT: Moist oral membranes.  NECK: No  JVD present. No thyromegaly noted. No carotid bruits  LYMPHATIC: No visible cervical adenopathy.  CHEST Normal respiratory effort. No intercostal retractions  LUNGS: Clear to auscultation bilaterally.  No stridor. No wheezes. No rales.  CARDIOVASCULAR: Regular rate and rhythm, positive D3-O6, soft holosystolic murmur heard at apex, no gallops or rubs. ABDOMINAL: No apparent ascites.  EXTREMITIES: No peripheral edema  HEMATOLOGIC: No significant bruising NEUROLOGIC: Oriented to person, place, and time. Nonfocal. Normal muscle tone.  PSYCHIATRIC: Normal mood and affect. Normal behavior. Cooperative  CARDIAC DATABASE: EKG: 08/24/2020: Normal sinus rhythm, 95 bpm, normal axis, without underlying ischemic pattern.  Echocardiogram: 01/06/2020:  Normal LV systolic function with visual EF 55-60%. Left ventricle cavity is normal in size. Mild left ventricular hypertrophy. Normal global wall motion. Indeterminate diastolic filling pattern, normal LAP. Calculated EF 55%.  Left atrial cavity is mildly dilated.  Mild (Grade I) mitral regurgitation.  IVC is dilated with a respiratory response of <50%.  No prior study for comparison.   Stress Testing: Exercise Myoview stress test 01/24/2020:  Exercise nuclear stress test was performed using Bruce protocol. Patient reached 7 METS, and 96% of age predicted maximum heart rate. Exercise capacity was low. Chest pain not reported. Heart rate and hemodynamic response were normal. Stress EKG revealed no ischemic changes.  Normal myocardial perfusion. Stress LVEF 67%. Low risk study,  Coronary artery calcification scoring performed on 01/05/2020 at [facility]:  Total calcium score: 0 AU  Incidental finding of a 29 mm left lobe liver cyst. Will inform the patient at next office visit. Will defer additional work-up to her primary care provider.  Heart Catheterization: None  LABORATORY DATA:  External Labs: Collected: 12/30/2019 Hemoglobin 14.6  g/dL Creatinine 0.73 mg/dL. eGFR: 86 mL/min per 1.73 m Potassium: 4.2 Lipid profile: Total cholesterol 276, triglycerides 180, HDL 74, LDL 167 TSH: 3.485  ProBNP: 18pg/mL  IMPRESSION:    ICD-10-CM   1. Benign hypertension  I10   2. Prediabetes  R73.03   3. Palpitations  R00.2 EKG 12-Lead  4. Hypercholesteremia  E78.00 Lipid Panel With LDL/HDL Ratio    LDL cholesterol, direct    Comp. Metabolic Panel (12)  5. Class 1 obesity due to excess calories without serious comorbidity with body mass index (BMI) of 33.0 to 33.9 in adult  E66.09    Z68.33      RECOMMENDATIONS: Barbette Mcglaun is a 65 y.o. female whose past medical history and cardiac risk factors include: Prediabetes, mixed hyperlipidemia, obesity due to excess calories, postmenopausal female, advanced age.  Benign essential hypertension:   Office blood pressure is currently at goal.  Home blood pressures are also very well controlled.  Medications reconciled.  Patient is very thankful for the medication changes recommended during prior office visits.    Continue to monitor.    Palpitations: resolved.   Continue Cardizem 120 mg p.o. daily  Monitor for now.   Dyspnea on exertion: Resolved.  Has undergone a comprehensive cardiovascular work-up including but not limited to echo, stress test, coronary artery calcification scoring, and Holter monitor.    Continue current medications.   Prediabetes and mixed hyperlipidemia:  Initially patient wanted to continue with  lifestyle modifications and to have it reevaluated.  Based on the most recent lipid profile and her risk factors her 10-year estimated risk of ASCVD is approximately 8%.  Based on the guidelines moderate to high intensity statin therapy is recommended.  Will recheck Lipids and patient is considering statin therapy.   Patient educated on importance of improving her cardiovascular risk factors for primary prevention of CAD.  FINAL MEDICATION LIST  END OF ENCOUNTER: No orders of the defined types were placed in this encounter.    Current Outpatient Medications:  .  buPROPion (WELLBUTRIN XL) 300 MG 24 hr tablet, Take 300 mg by mouth daily., Disp: , Rfl:  .  CARTIA XT 120 MG 24 hr capsule, TAKE ONE CAPSULE BY MOUTH EVERY MORNING, Disp: 90 capsule, Rfl: 1 .  cetirizine (ZYRTEC) 10 MG tablet, Take 1 tablet by mouth at bedtime., Disp: , Rfl:  .  Cholecalciferol 25 MCG (1000 UT) capsule, Take 1 capsule by mouth daily., Disp: , Rfl:  .  furosemide (LASIX) 20 MG tablet, Take 1 tablet by mouth as needed., Disp: , Rfl:  .  Magnesium 250 MG TABS, Take 1 tablet by mouth daily., Disp: , Rfl:  .  metFORMIN (GLUCOPHAGE-XR) 500 MG 24 hr tablet, Take 2 tablets by mouth daily., Disp: , Rfl:   Orders Placed This Encounter  Procedures  . Lipid Panel With LDL/HDL Ratio  . LDL cholesterol, direct  . Comp. Metabolic Panel (12)  . EKG 12-Lead    There are no Patient Instructions on file for this visit.  Months --Continue cardiac medications as reconciled in final medication list. --Return in about 6 months (around 02/21/2021) for Follow up, Palpitations, Lipid. Or sooner if needed. --Continue follow-up with your primary care physician regarding the management of your other chronic comorbid conditions.  Patient's questions and concerns were addressed to her satisfaction. She voices understanding of the instructions provided during this encounter.   This note was created using a voice recognition software as a result there may be grammatical errors inadvertently enclosed that do not reflect the nature of this encounter. Every attempt is made to correct such errors.  Total time spent: 34 minutes.  Rex Kras, [place], [facility]  Pager: [phone] Office: [phone]

## 2020-09-09 LAB — LIPID PANEL WITH LDL/HDL RATIO
Cholesterol, Total: 271 mg/dL — ABNORMAL HIGH (ref 100–199)
HDL: 74 mg/dL (ref >39)
LDL Chol Calc (NIH): 172 mg/dL — ABNORMAL HIGH (ref 0–99)
LDL/HDL Ratio: 2.3 ratio (ref 0.0–3.2)
Triglycerides: 139 mg/dL (ref 0–149)
VLDL Cholesterol Cal: 25 mg/dL (ref 5–40)

## 2020-09-09 LAB — COMP. METABOLIC PANEL (12)
AST: 17 IU/L (ref 0–40)
Albumin/Globulin Ratio: 2.2 (ref 1.2–2.2)
Albumin: 4.6 g/dL (ref 3.8–4.8)
Alkaline Phosphatase: 97 IU/L (ref 44–121)
BUN/Creatinine Ratio: 14 (ref 12–28)
BUN: 13 mg/dL (ref 8–27)
Bilirubin Total: 0.3 mg/dL (ref 0.0–1.2)
Calcium: 9.7 mg/dL (ref 8.7–10.3)
Chloride: 103 mmol/L (ref 96–106)
Creatinine, Ser: 0.9 mg/dL (ref 0.57–1.00)
GFR calc Af Amer: 77 mL/min/{1.73_m2} (ref >59)
GFR calc non Af Amer: 67 mL/min/{1.73_m2} (ref >59)
Globulin, Total: 2.1 g/dL (ref 1.5–4.5)
Glucose: 114 mg/dL — ABNORMAL HIGH (ref 65–99)
Potassium: 4.8 mmol/L (ref 3.5–5.2)
Sodium: 142 mmol/L (ref 134–144)
Total Protein: 6.7 g/dL (ref 6.0–8.5)

## 2020-09-09 LAB — LDL CHOLESTEROL, DIRECT: LDL Direct: 175 mg/dL — ABNORMAL HIGH (ref 0–99)

## 2020-09-13 ENCOUNTER — Other Ambulatory Visit: Payer: Self-pay | Admitting: Cardiology

## 2020-09-13 DIAGNOSIS — E7800 Pure hypercholesterolemia, unspecified: Secondary | ICD-10-CM

## 2020-09-13 MED ORDER — ATORVASTATIN CALCIUM 20 MG PO TABS: 20.0000 mg | ORAL_TABLET | Freq: Every day | ORAL | 0 refills | Status: DC

## 2020-10-27 ENCOUNTER — Other Ambulatory Visit: Payer: Self-pay | Admitting: Cardiology

## 2020-10-27 DIAGNOSIS — R002 Palpitations: Secondary | ICD-10-CM

## 2020-10-27 LAB — CMP14+EGFR
ALT: 16 IU/L (ref 0–32)
AST: 13 IU/L (ref 0–40)
Albumin/Globulin Ratio: 2.5 — ABNORMAL HIGH (ref 1.2–2.2)
Albumin: 4.3 g/dL (ref 3.8–4.8)
Alkaline Phosphatase: 86 IU/L (ref 44–121)
BUN/Creatinine Ratio: 13 (ref 12–28)
BUN: 10 mg/dL (ref 8–27)
Bilirubin Total: 0.2 mg/dL (ref 0.0–1.2)
CO2: 23 mmol/L (ref 20–29)
Calcium: 9.2 mg/dL (ref 8.7–10.3)
Chloride: 104 mmol/L (ref 96–106)
Creatinine, Ser: 0.77 mg/dL (ref 0.57–1.00)
Globulin, Total: 1.7 g/dL (ref 1.5–4.5)
Glucose: 118 mg/dL — ABNORMAL HIGH (ref 65–99)
Potassium: 4.8 mmol/L (ref 3.5–5.2)
Sodium: 143 mmol/L (ref 134–144)
Total Protein: 6 g/dL (ref 6.0–8.5)
eGFR: 85 mL/min/{1.73_m2} (ref >59)

## 2020-10-27 LAB — LIPID PANEL WITH LDL/HDL RATIO
Cholesterol, Total: 160 mg/dL (ref 100–199)
HDL: 62 mg/dL (ref >39)
LDL Chol Calc (NIH): 80 mg/dL (ref 0–99)
LDL/HDL Ratio: 1.3 ratio (ref 0.0–3.2)
Triglycerides: 100 mg/dL (ref 0–149)
VLDL Cholesterol Cal: 18 mg/dL (ref 5–40)

## 2020-10-27 LAB — LDL CHOLESTEROL, DIRECT: LDL Direct: 71 mg/dL (ref 0–99)

## 2020-10-30 NOTE — Progress Notes (Signed)
done

## 2020-12-05 ENCOUNTER — Other Ambulatory Visit: Payer: Self-pay | Admitting: Internal Medicine

## 2020-12-07 ENCOUNTER — Other Ambulatory Visit: Payer: Self-pay | Admitting: Internal Medicine

## 2020-12-07 DIAGNOSIS — N644 Mastodynia: Secondary | ICD-10-CM

## 2020-12-08 ENCOUNTER — Other Ambulatory Visit: Payer: Self-pay | Admitting: Cardiology

## 2020-12-08 DIAGNOSIS — E7800 Pure hypercholesterolemia, unspecified: Secondary | ICD-10-CM

## 2021-01-15 ENCOUNTER — Emergency Department (HOSPITAL_COMMUNITY)
Admission: EM | Admit: 2021-01-15 | Discharge: 2021-01-15 | Disposition: A | Discharge status: Home / Self Care | Payer: Medicare Other | Attending: Emergency Medicine | Admitting: Emergency Medicine

## 2021-01-15 ENCOUNTER — Other Ambulatory Visit: Payer: Self-pay

## 2021-01-15 ENCOUNTER — Emergency Department (HOSPITAL_COMMUNITY): Payer: Medicare Other

## 2021-01-15 ENCOUNTER — Encounter (HOSPITAL_COMMUNITY): Payer: Self-pay

## 2021-01-15 DIAGNOSIS — Z7984 Long term (current) use of oral hypoglycemic drugs: Secondary | ICD-10-CM | POA: Insufficient documentation

## 2021-01-15 DIAGNOSIS — M25562 Pain in left knee: Secondary | ICD-10-CM | POA: Diagnosis present

## 2021-01-15 DIAGNOSIS — M79605 Pain in left leg: Secondary | ICD-10-CM | POA: Insufficient documentation

## 2021-01-15 DIAGNOSIS — R7303 Prediabetes: Secondary | ICD-10-CM | POA: Insufficient documentation

## 2021-01-15 DIAGNOSIS — I10 Essential (primary) hypertension: Secondary | ICD-10-CM | POA: Insufficient documentation

## 2021-01-15 DIAGNOSIS — Z79899 Other long term (current) drug therapy: Secondary | ICD-10-CM | POA: Diagnosis not present

## 2021-01-15 IMAGING — CR DG KNEE COMPLETE 4+V*L*
4 series · 4 of 4 positions shown · non-contrast
Comparison: None.

CLINICAL DATA: Knee pain.  No trauma

EXAM:
LEFT KNEE - COMPLETE 4+ VIEW

[knee ap]
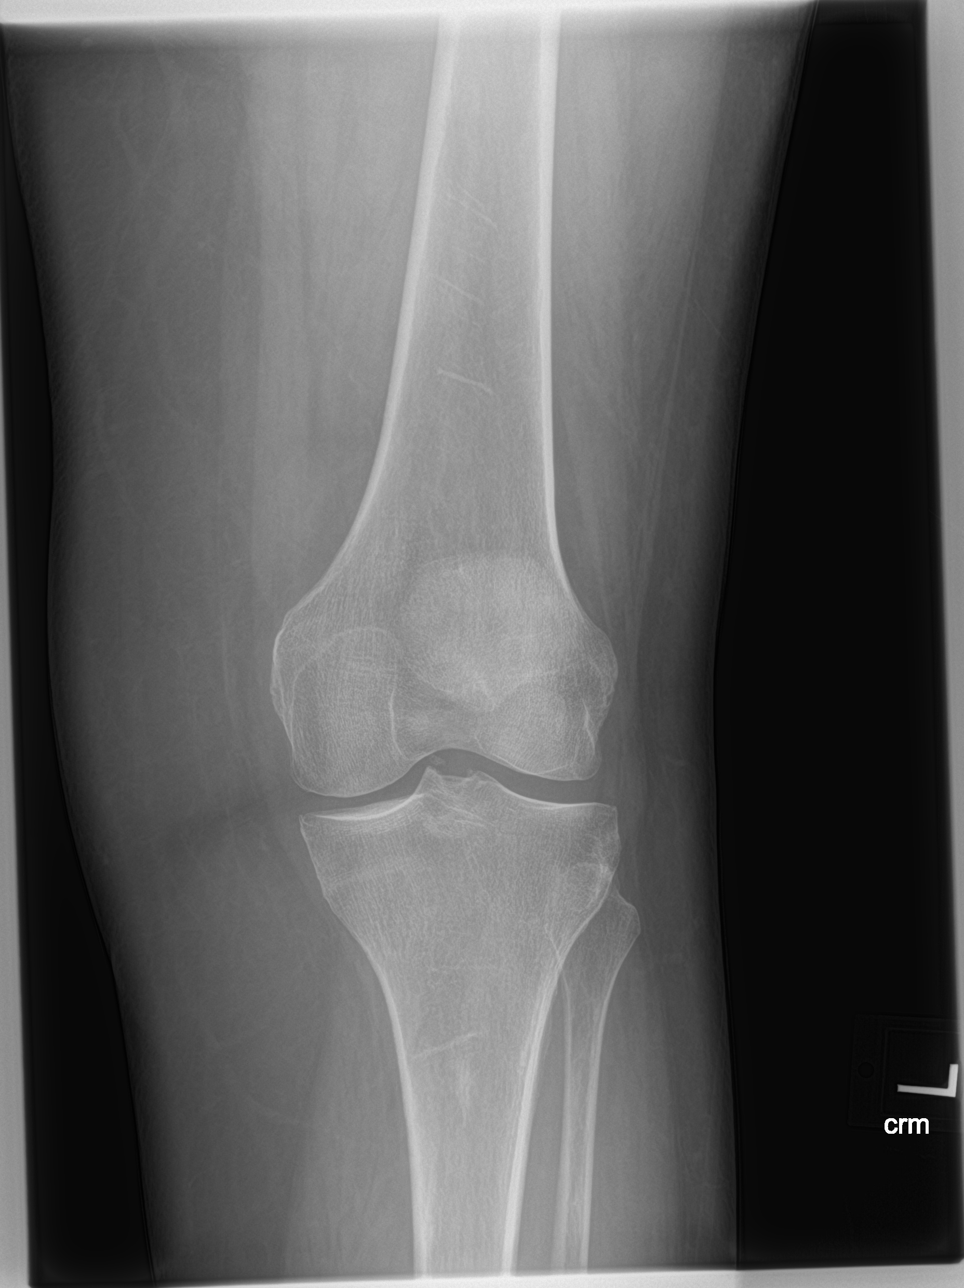

[knee lat]
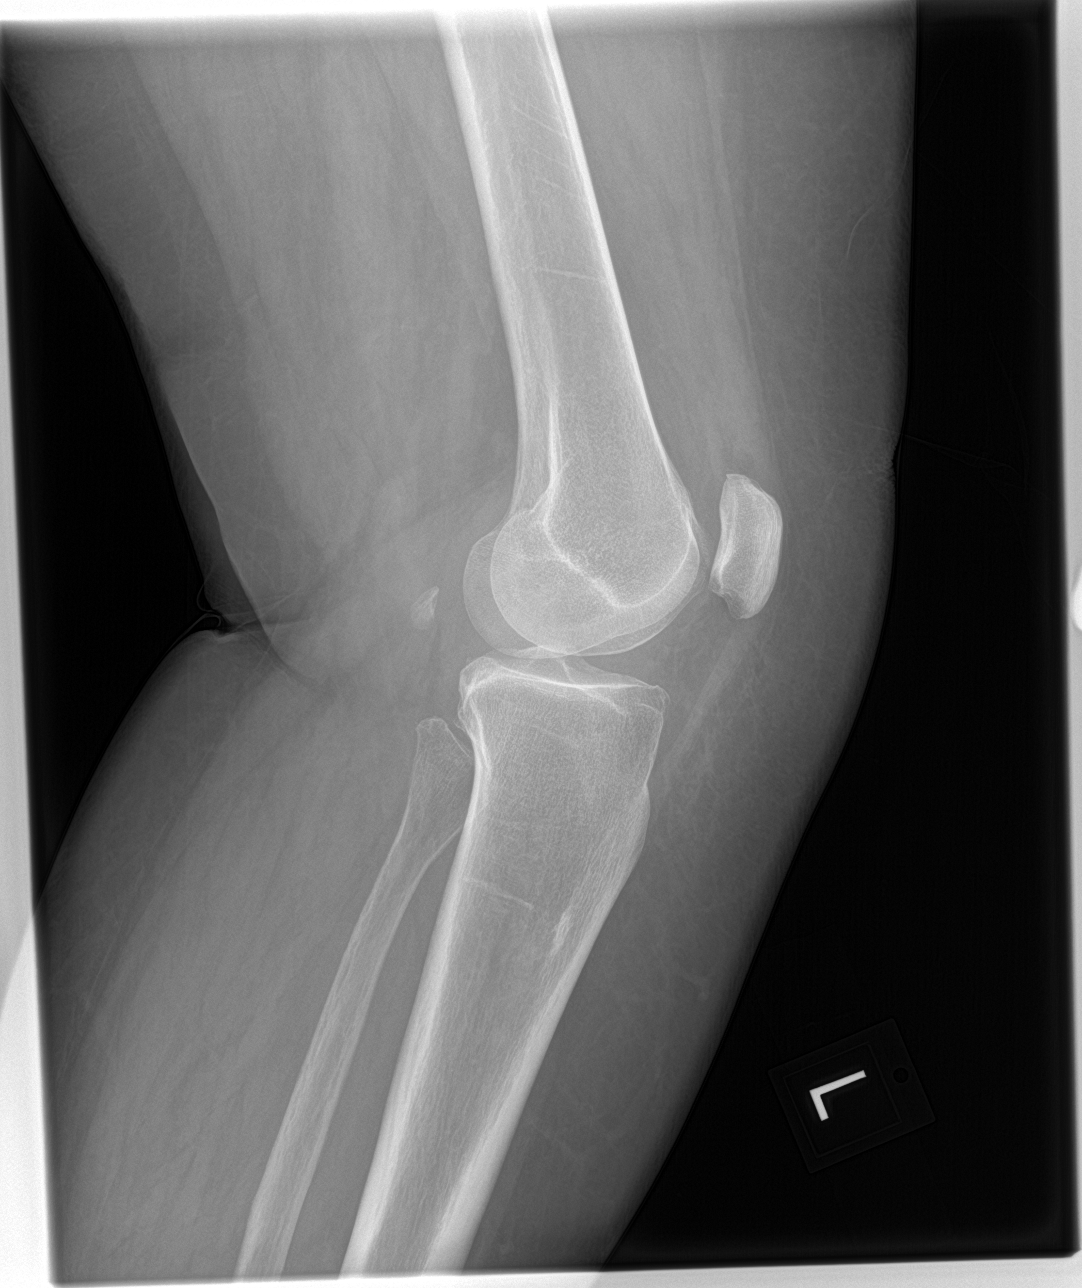

[knee obl (1 of 2)]
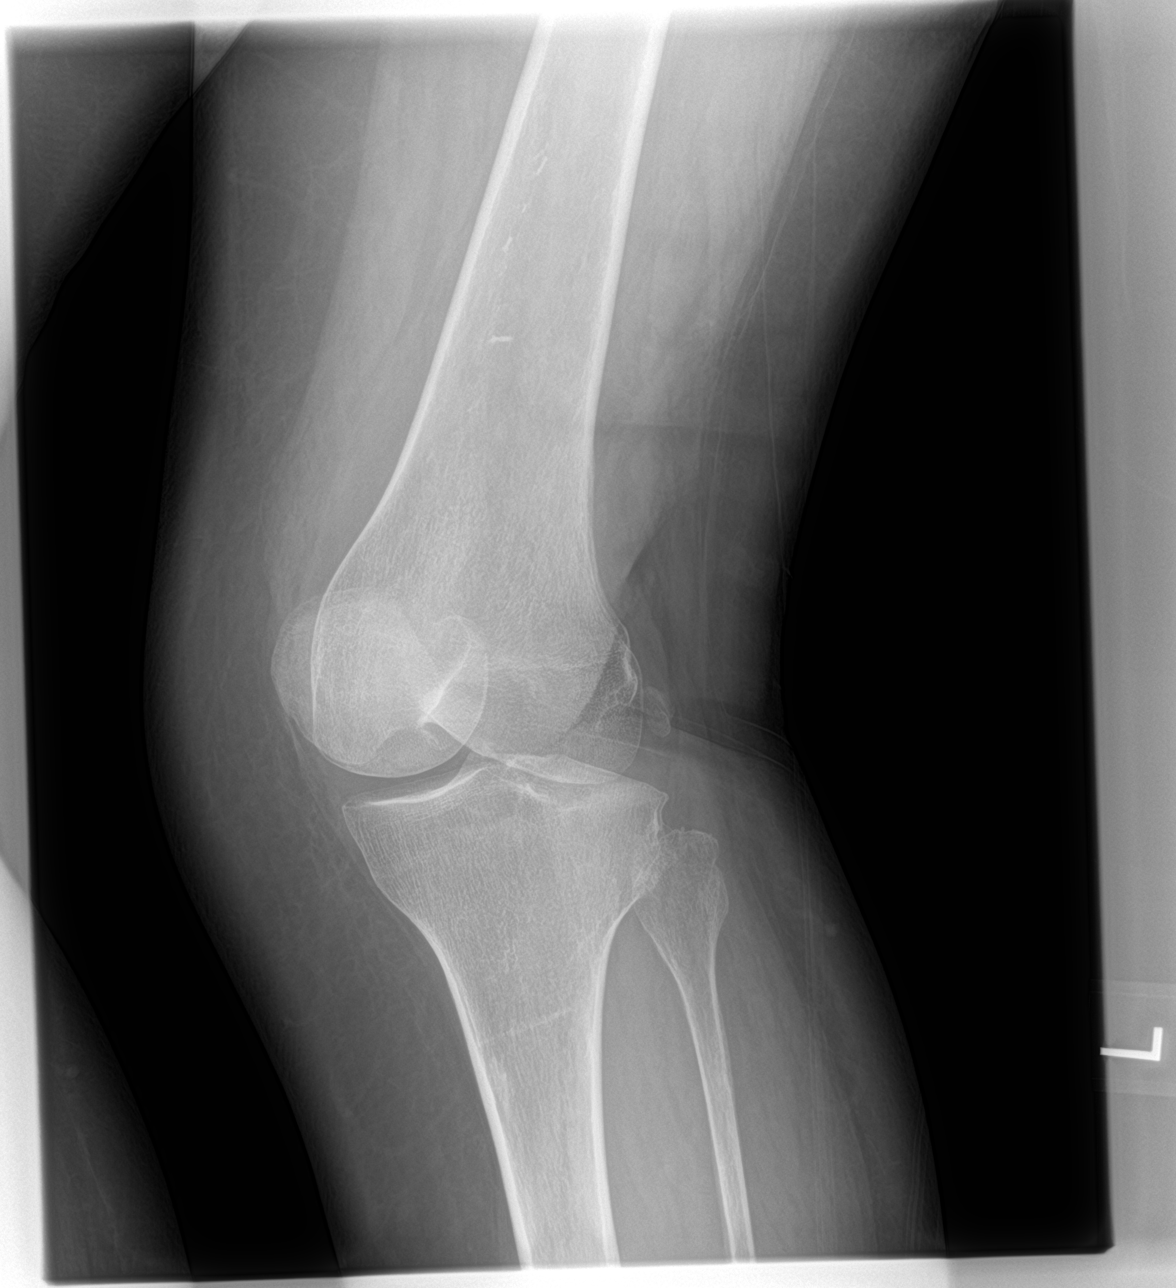

[knee obl (2 of 2)]
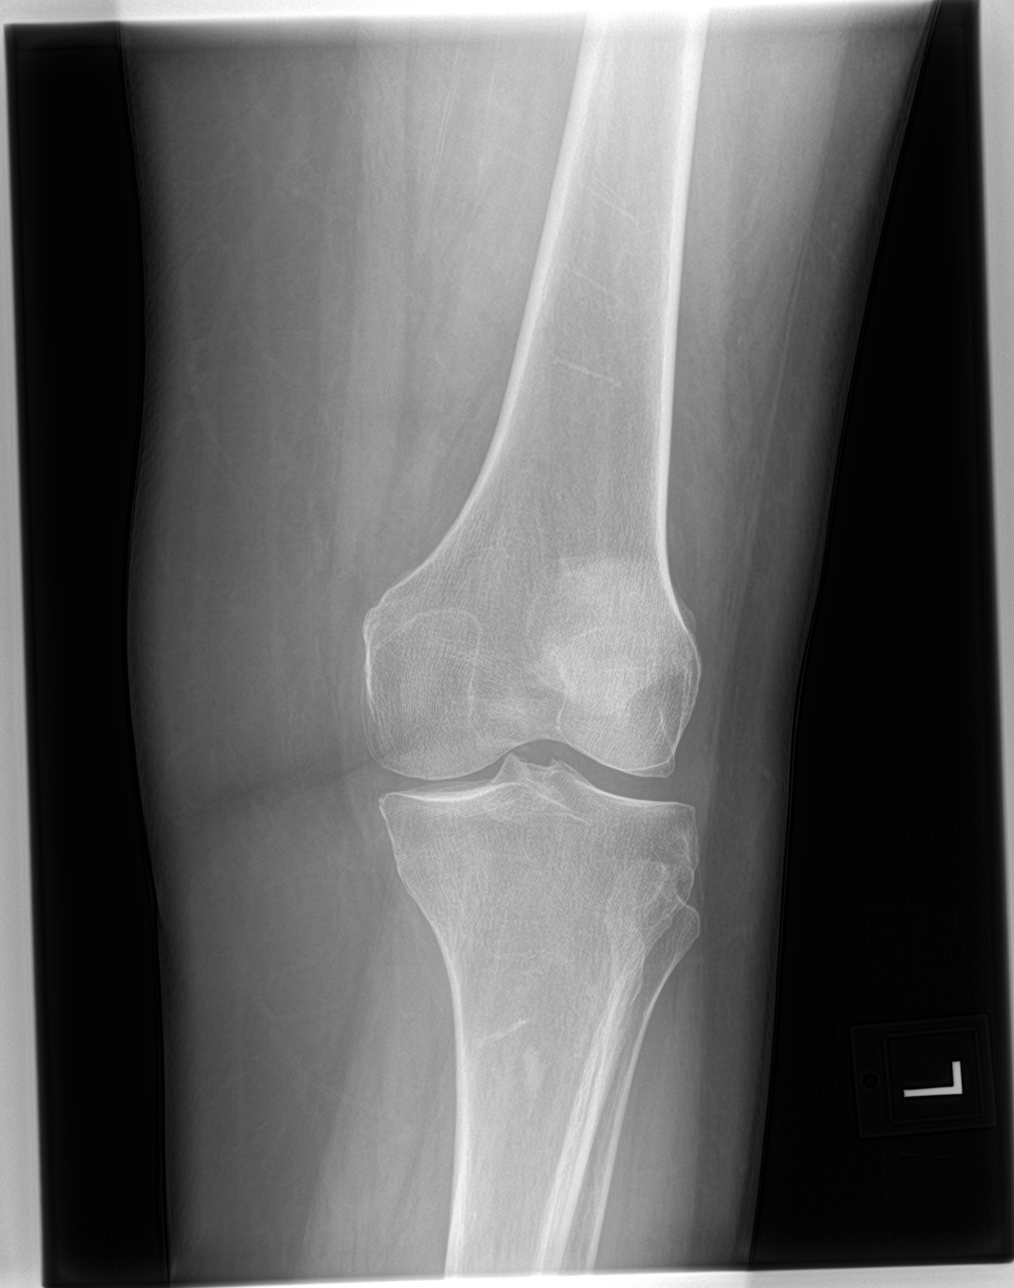

[4 of 4 positions shown; findings below may reference images not displayed]

FINDINGS: No fracture of the proximal tibia or distal femur. Patella is
normal. No joint effusion. Loose body posterior the joint
IMPRESSION: No acute osseous abnormality.

## 2021-01-15 MED ORDER — CYCLOBENZAPRINE HCL 10 MG PO TABS: 10.0000 mg | ORAL_TABLET | Freq: Two times a day (BID) | ORAL | 0 refills | Status: AC

## 2021-01-15 NOTE — Progress Notes (Signed)
Orthopedic Tech Progress Note Patient Details:  Lauren [name] 1956/05/09 [id number]  Ortho Devices Type of Ortho Device: Knee Immobilizer Ortho Device/Splint Location: LLE Ortho Device/Splint Interventions: Ordered, Application, Adjustment   Post Interventions Patient Tolerated: Well, Ambulated well Instructions Provided: Care of device, Poper ambulation with device  Janit Pagan 01/15/2021, 12:50 PM

## 2021-01-15 NOTE — ED Triage Notes (Signed)
Patient complains of knee pain after moving from chair several days ago. Yesterday increased pain and now unable to ambulate due to same. No fall.

## 2021-01-15 NOTE — ED Provider Notes (Signed)
[name] EMERGENCY DEPARTMENT Provider Note   CSN: [id number] Arrival date & time: 01/15/21  3300     History CC:  Left knee pain   Lauren [name] is a 65 y.o. female presenting emergency department with left knee and leg pain.  She reports that she may have twisted her knee about 2 weeks ago.  She was elevating it and resting it and it seemed to be getting better.  Last night she feels like she accidentally twisted her knee again, and felt a crackle and a pop near her left side of her left knee.  She has had significant pain since then, worse with weightbearing.  She feels like she having a very difficult time walking at all due to pain.  She reports some shooting pains up the left side of her thigh, but not down into her toes, no numbness or weakness.  She is not on blood thinners.  He has no history of knee surgeries.  She does not have an orthopedic doctor here.  HPI     Past Medical History:  Diagnosis Date   Carcinoid tumor 2007   Depression    Hyperlipidemia    Hypertension    Obesity    Prediabetes     There are no problems to display for this patient.   Past Surgical History:  Procedure Laterality Date   TOTAL ABDOMINAL HYSTERECTOMY  2007     OB History   No obstetric history on file.     Family History  Problem Relation Age of Onset   Lung cancer Mother    Bone cancer Father    Brain cancer Father     Social History   Tobacco Use   Smoking status: Never   Smokeless tobacco: Never  Vaping Use   Vaping Use: Never used  Substance Use Topics   Alcohol use: Never   Drug use: Never    Home Medications Prior to Admission medications   Medication Sig Start Date End Date Taking? Authorizing Provider  cyclobenzaprine (FLEXERIL) 10 MG tablet Take 1 tablet (10 mg total) by mouth 2 (two) times daily for 20 days. 01/15/21 02/04/21 Yes Erion Weightman, Carola Rhine, MD  atorvastatin (LIPITOR) 20 MG tablet TAKE 1 TABLET BY MOUTH EVERY NIGHT AT  BEDTIME 12/08/20   Tolia, Sunit, DO  buPROPion (WELLBUTRIN XL) 300 MG 24 hr tablet Take 300 mg by mouth daily.    [provider]  cetirizine (ZYRTEC) 10 MG tablet Take 1 tablet by mouth at bedtime.    [provider]  Cholecalciferol 25 MCG (1000 UT) capsule Take 1 capsule by mouth daily.    [provider]  diltiazem (CARDIZEM CD) 120 MG 24 hr capsule TAKE ONE CAPSULE BY MOUTH EVERY MORNING 10/31/20   Tolia, Sunit, DO  furosemide (LASIX) 20 MG tablet Take 1 tablet by mouth as needed. 07/13/19   [provider]  Magnesium 250 MG TABS Take 1 tablet by mouth daily.    [provider]  metFORMIN (GLUCOPHAGE-XR) 500 MG 24 hr tablet Take 2 tablets by mouth daily. 07/19/19 08/24/20  [provider]    Allergies    Codeine and Sulfa antibiotics  Review of Systems   Review of Systems  Constitutional:  Negative for chills and fever.  Musculoskeletal:  Positive for arthralgias and myalgias.  Skin:  Positive for color change. Negative for rash.  Neurological:  Negative for weakness and numbness.   Physical Exam Updated Vital Signs BP 121/60 (BP Location:  Right Arm)   Pulse 80   Temp 98.9 F (37.2 C) (Oral)   Resp 16   SpO2 97%   Physical Exam Constitutional:      General: She is not in acute distress. HENT:     Head: Normocephalic and atraumatic.  Eyes:     Conjunctiva/sclera: Conjunctivae normal.     Pupils: Pupils are equal, round, and reactive to light.  Cardiovascular:     Rate and Rhythm: Normal rate and regular rhythm.  Musculoskeletal:     Comments: No palpable peri-patellar effusion No isolated tenderness of the patella No isolated tenderness of the fibular head Patient able to flex knee to 90 degrees Patient able to bear weight immediately after incident and here in the ED. No evidence of posterior knee dislocation. Distal extremity is neurovascularly intact. Lateral aspect of knee with small ecchymosis overlying  muscle    Skin:    General: Skin is warm and dry.  Neurological:     General: No focal deficit present.     Mental Status: She is alert and oriented to person, place, and time. Mental status is at baseline.     Sensory: No sensory deficit.     Motor: No weakness.  Psychiatric:        Mood and Affect: Mood normal.        Behavior: Behavior normal.    ED Results / Procedures / Treatments   Labs (all labs ordered are listed, but only abnormal results are displayed) Labs Reviewed - No data to display  EKG None  Radiology DG Knee Complete 4 Views Left  Result Date: 01/15/2021 CLINICAL DATA:  Knee pain.  No trauma EXAM: LEFT KNEE - COMPLETE 4+ VIEW COMPARISON:  None. FINDINGS: No fracture of the proximal tibia or distal femur. Patella is normal. No joint effusion. Loose body posterior the joint IMPRESSION: No acute osseous abnormality. Electronically Signed   By: Suzy Bouchard M.D.   On: 01/15/2021 10:33    Procedures Procedures   Medications Ordered in ED Medications - No data to display  ED Course  I have reviewed the triage vital signs and the nursing notes.  Pertinent labs & imaging results that were available during my care of the patient were reviewed by me and considered in my medical decision making (see chart for details).  Left knee pain Full ROM and neurovascularly intact on exam Xrays reviewed -no acute fx noted  I suspect this is most likely a muscle injury, possible sartorius (?) given the bruising on the lateral leg  Doubt septic joint or acute fracture Also less likely an ACL/MCL tear  We provided a knee immobilizer for comfort and stability Muscle relaxer prescription Can F/u with ortho     Final Clinical Impression(s) / ED Diagnoses Final diagnoses:  Acute pain of left knee    Rx / DC Orders ED Discharge Orders          Ordered    cyclobenzaprine (FLEXERIL) 10 MG tablet  2 times daily        01/15/21 1213             Wyvonnia Dusky, MD 01/15/21 1643

## 2021-01-18 ENCOUNTER — Other Ambulatory Visit: Payer: Self-pay

## 2021-01-18 ENCOUNTER — Ambulatory Visit: Payer: Medicare Other

## 2021-01-18 ENCOUNTER — Ambulatory Visit
Admission: RE | Admit: 2021-01-18 | Discharge: 2021-01-18 | Disposition: A | Discharge status: Home / Self Care | Payer: Medicare Other | Source: Ambulatory Visit | Attending: Internal Medicine | Admitting: Internal Medicine

## 2021-01-18 DIAGNOSIS — N644 Mastodynia: Secondary | ICD-10-CM

## 2021-01-18 IMAGING — MG DIGITAL DIAGNOSTIC BILAT W/ TOMO W/ CAD
8 series · 8 of 24 positions shown · non-contrast
Comparison: Previous exam(s).

CLINICAL DATA: 65-year-old female with diffuse right breast pain
for 2-3 months. Family history of breast cancer diagnosed in her
mother in her 40s.

EXAM:
DIGITAL DIAGNOSTIC BILATERAL MAMMOGRAM WITH TOMOSYNTHESIS AND CAD
TECHNIQUE: Bilateral digital diagnostic mammography and breast tomosynthesis
was performed. The images were evaluated with computer-aided
detection.

[R MLO synth-2D]
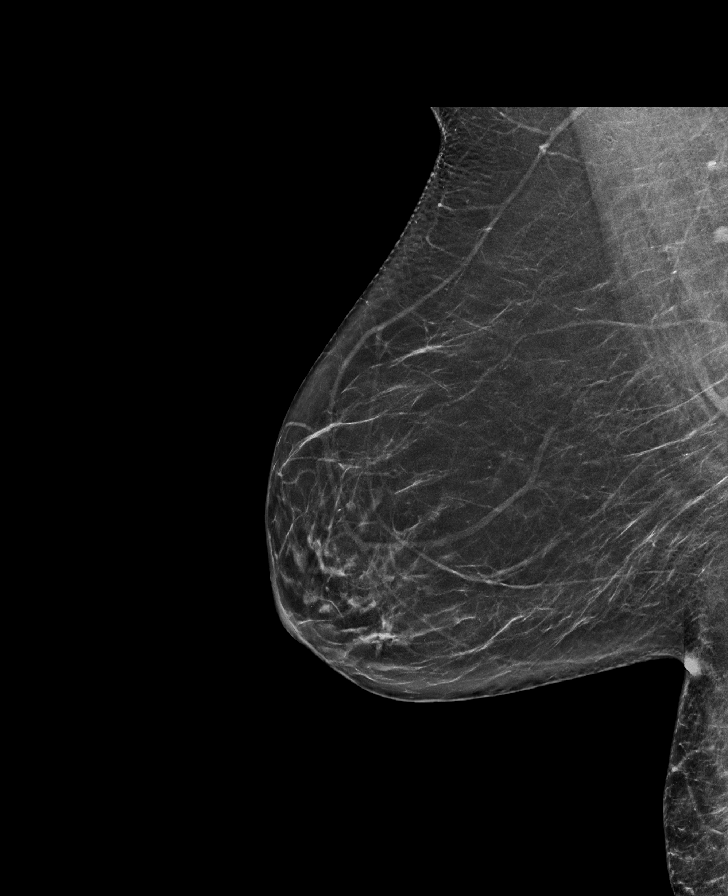

[L MLO synth-2D]
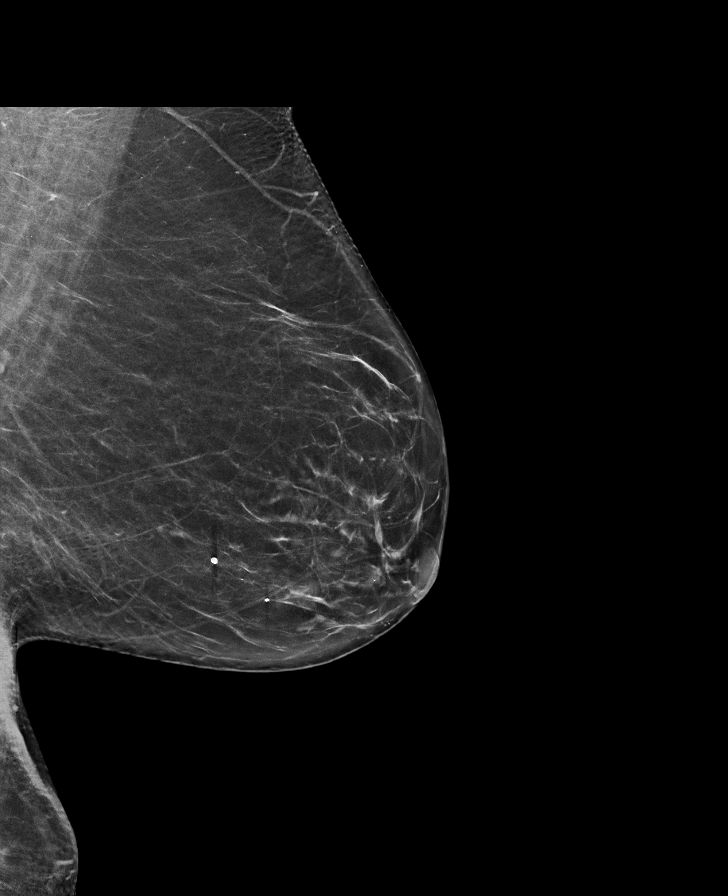

[L CC synth-2D]
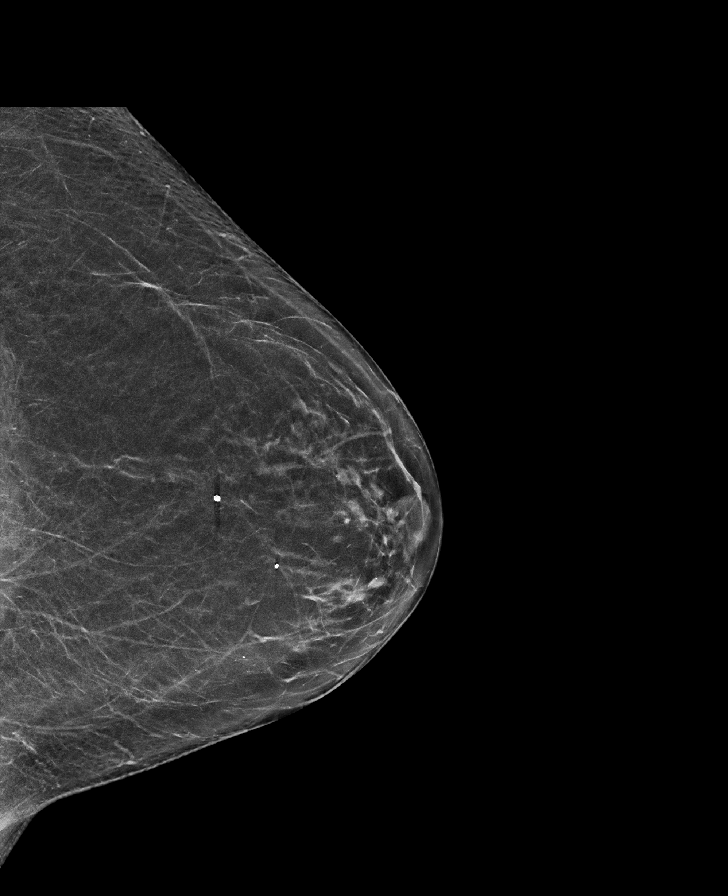

[R CC synth-2D]
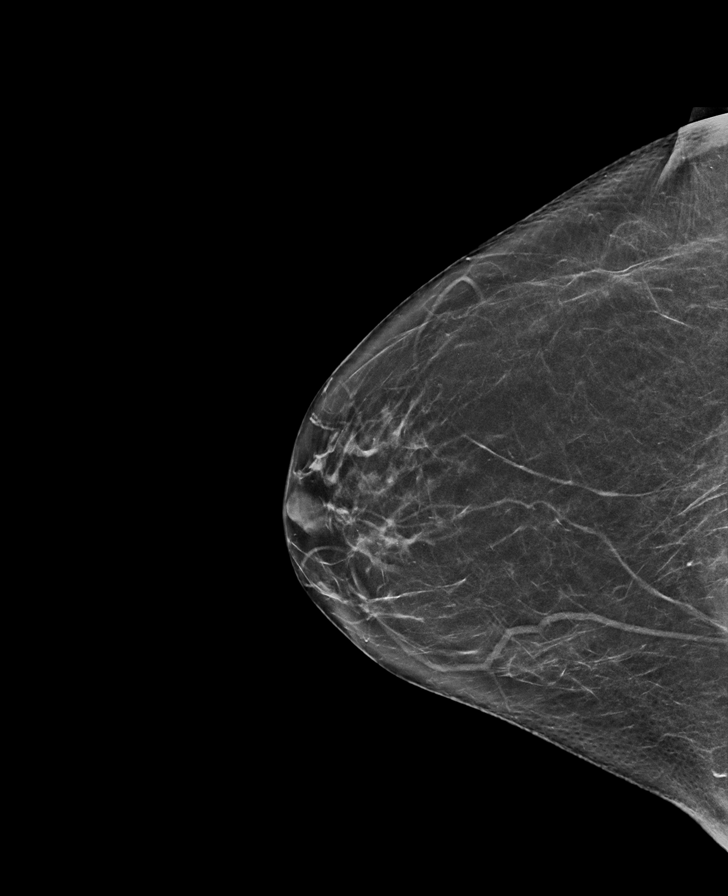

[L CC tomo · tomo slice 33/65.0]
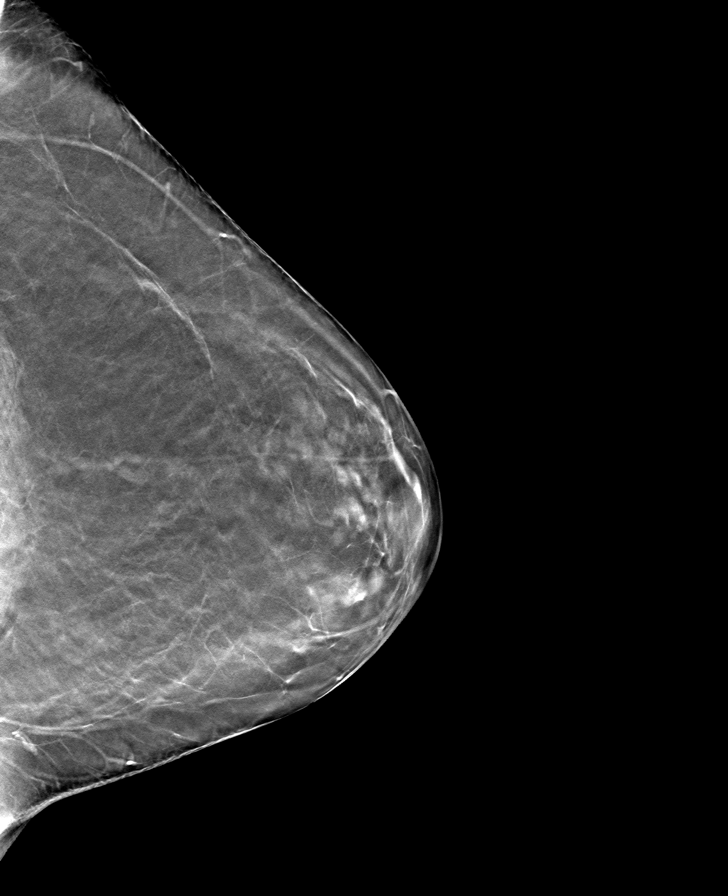

[L MLO tomo · tomo slice 35/69.0]
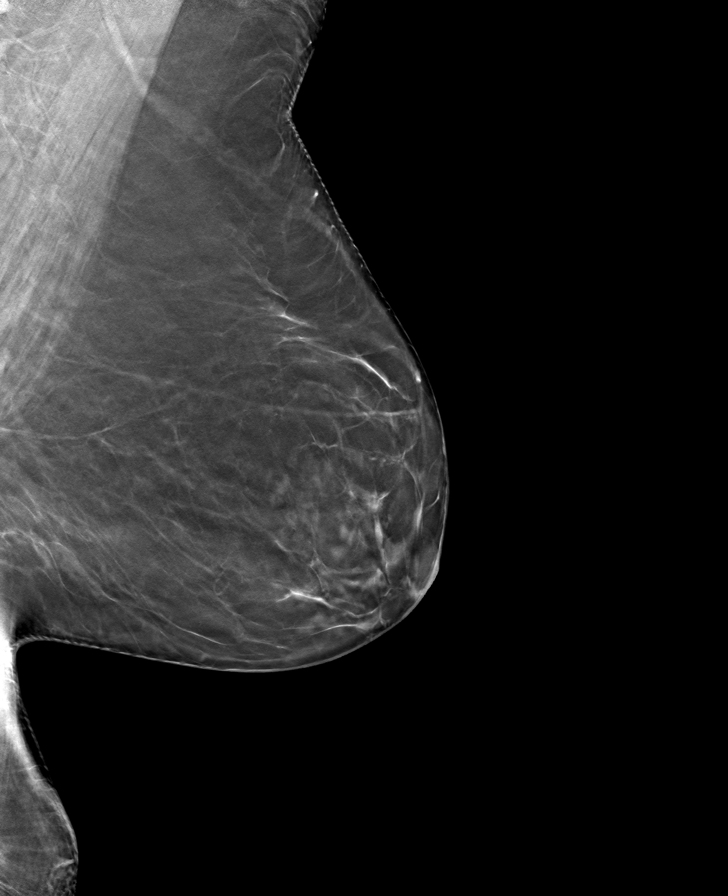

[R MLO tomo · tomo slice 35/68.0]
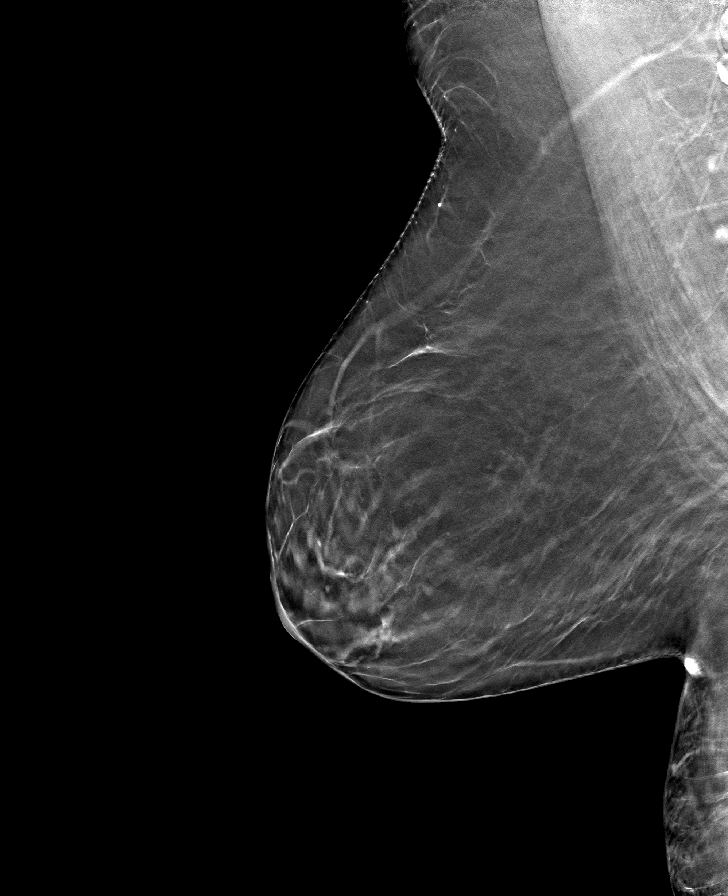

[R CC tomo · tomo slice 32/63.0]
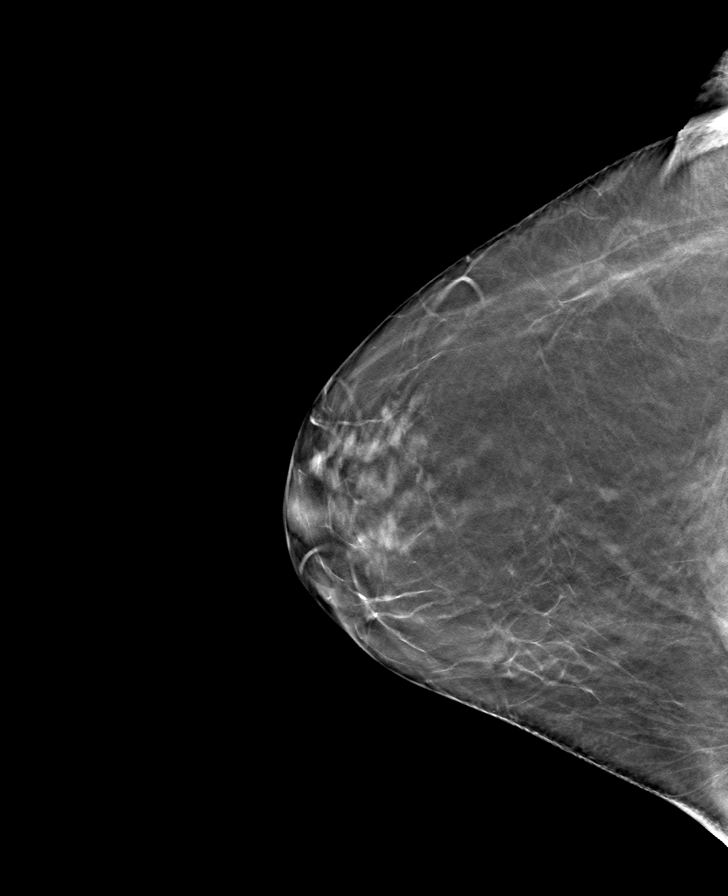

[8 of 24 positions shown; findings below may reference images not displayed]

ACR Breast Density Category b: There are scattered areas of
fibroglandular density.
FINDINGS: There are no suspicious mammographic findings in either breast. The
parenchymal pattern is stable.
IMPRESSION: No mammographic evidence of malignancy in either breast.

RECOMMENDATION:
1. Clinical follow-up recommended for the painful area of concern in
the right breast. Any further workup should be based on clinical
grounds.
2.  Screening mammogram in one year.(Code:[2J])

I have discussed the findings and recommendations with the patient.
If applicable, a reminder letter will be sent to the patient
regarding the next appointment.

BI-RADS CATEGORY  1: Negative.

## 2021-02-05 ENCOUNTER — Other Ambulatory Visit (HOSPITAL_COMMUNITY): Payer: Self-pay | Admitting: Orthopedic Surgery

## 2021-02-05 DIAGNOSIS — M1712 Unilateral primary osteoarthritis, left knee: Secondary | ICD-10-CM

## 2021-02-10 ENCOUNTER — Ambulatory Visit (HOSPITAL_COMMUNITY)
Admission: RE | Admit: 2021-02-10 | Discharge: 2021-02-10 | Disposition: A | Discharge status: Home / Self Care | Payer: Medicare Other | Source: Ambulatory Visit | Attending: Orthopedic Surgery | Admitting: Orthopedic Surgery

## 2021-02-10 ENCOUNTER — Other Ambulatory Visit: Payer: Self-pay

## 2021-02-10 DIAGNOSIS — M1712 Unilateral primary osteoarthritis, left knee: Secondary | ICD-10-CM | POA: Insufficient documentation

## 2021-02-10 IMAGING — MR MR KNEE*L* W/O CM
6 series · 38 of 40 positions shown · non-contrast
Comparison: Radiograph [DATE].

CLINICAL DATA: Left knee pain for 1 month after sitting and hearing
a pop. No relief from injection 2 weeks prior. History of small
bowel carcinoid tumor.

EXAM:
MRI OF THE LEFT KNEE WITHOUT CONTRAST
TECHNIQUE: Multiplanar, multisequence MR imaging of the knee was performed. No
intravenous contrast was administered.

[Series 5: T2 fat-sat · axial · left · 4.0mm · 0.50mm/px · z∈[-94,+46]mm · 9 of 33 slices shown (1 of 3)]
[im 1/33]
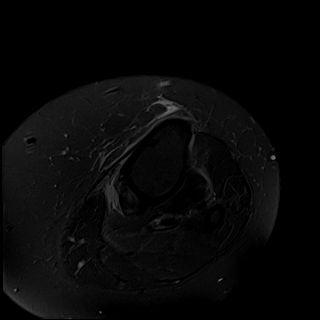
[im 5/33]
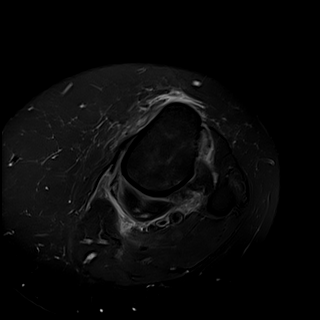
[im 9/33]
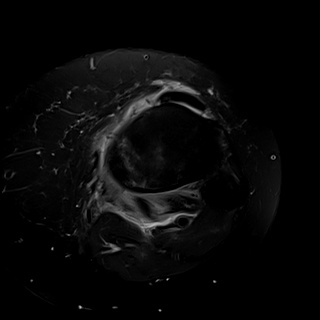
[im 13/33]
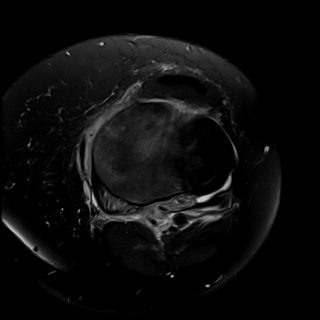
[im 17/33]
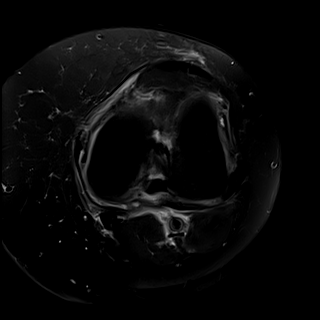
[im 21/33]
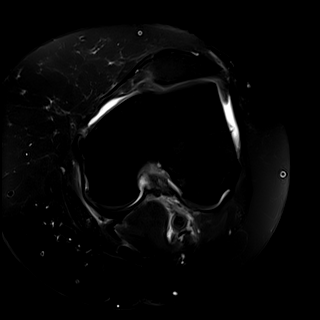
[im 25/33]
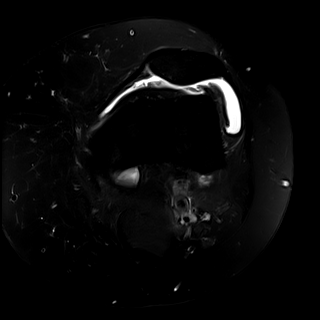
[im 29/33]
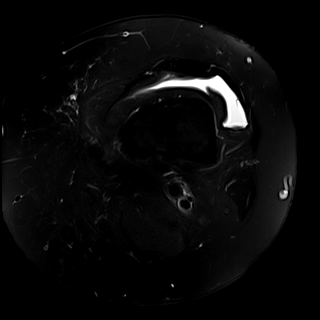
[im 33/33]
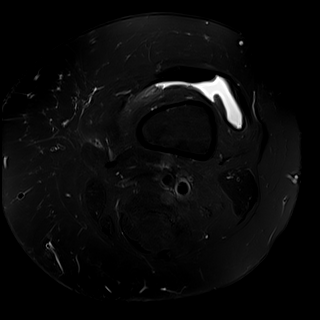

[Series 6: T1 · coronal · left · 4.0mm · 0.29mm/px · 5 of 25 slices shown]
[im 1/25]
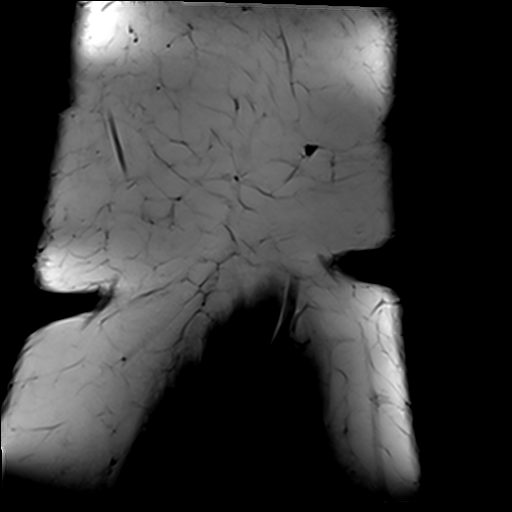
[im 5/25]
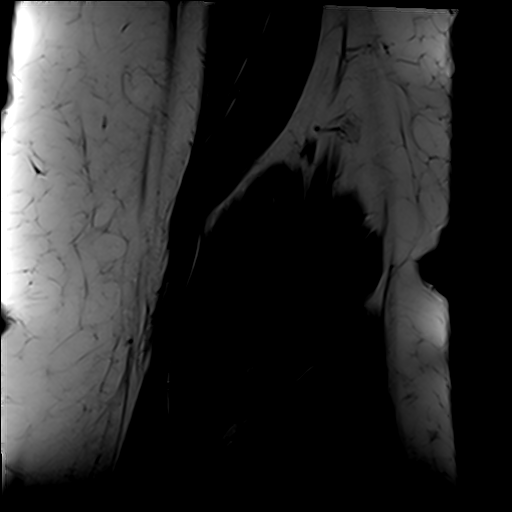
[im 9/25]
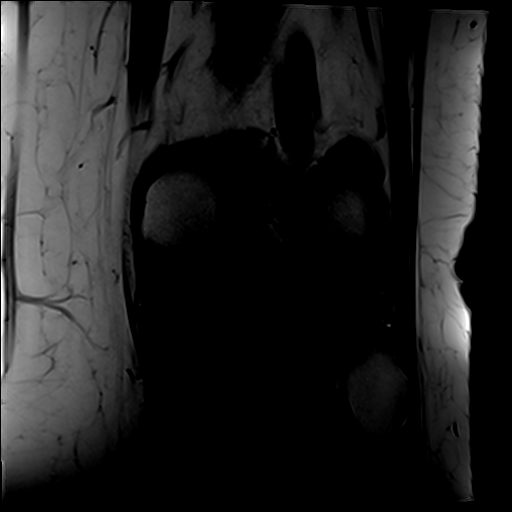
[im 13/25]
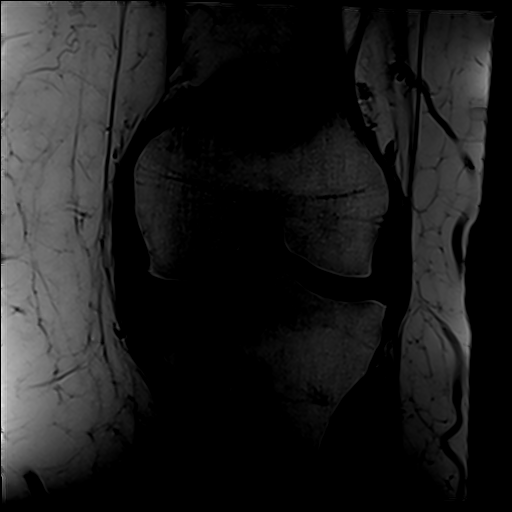
[im 17/25]
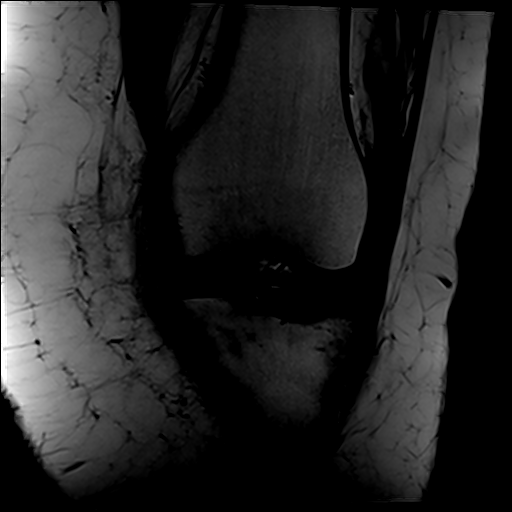

[Series 7: T2 fat-sat · coronal · left · 4.0mm · 0.62mm/px · 7 of 25 slices shown (2 of 3)]
[im 1/25]
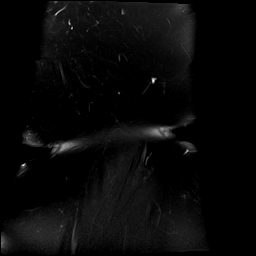
[im 5/25]
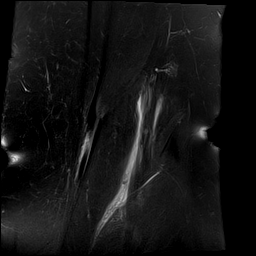
[im 9/25]
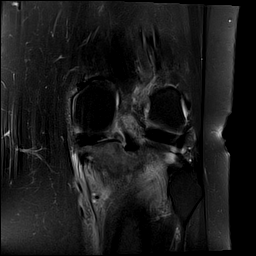
[im 13/25]
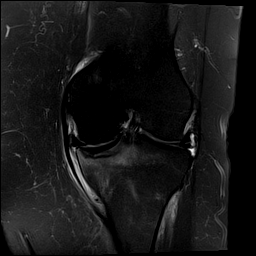
[im 17/25]
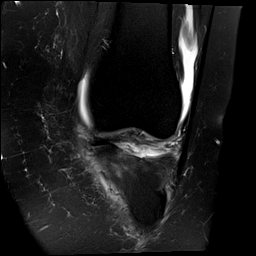
[im 21/25]
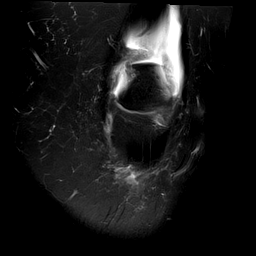
[im 25/25]
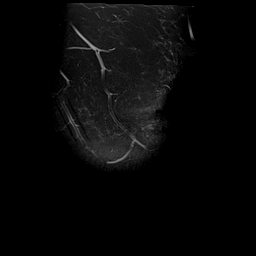

[Series 8: PD fat-sat · coronal · left · 4.0mm · 0.47mm/px · 7 of 25 slices shown (1 of 2)]
[im 1/25]
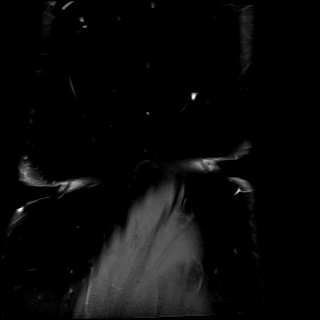
[im 5/25]
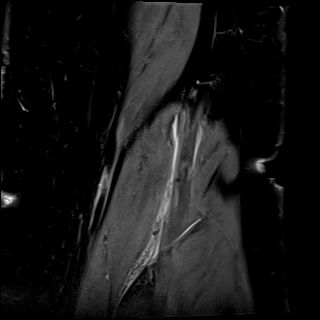
[im 9/25]
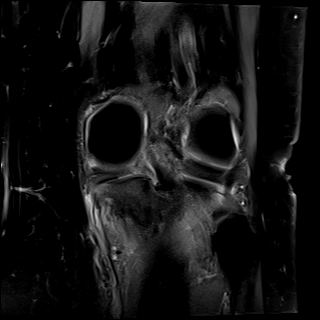
[im 13/25]
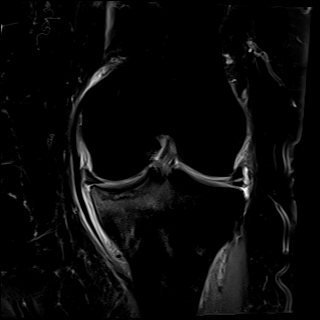
[im 17/25]
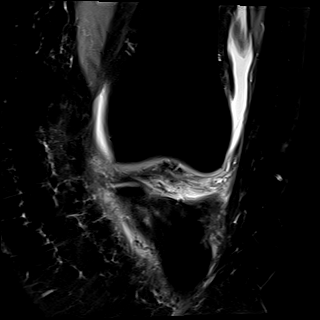
[im 21/25]
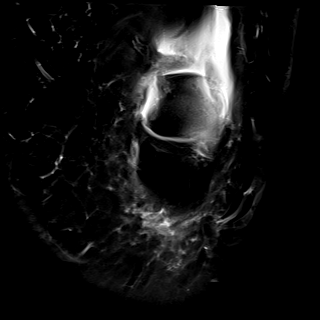
[im 25/25]
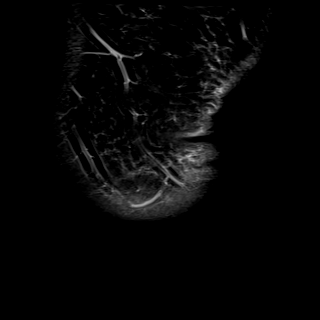

[Series 9: PD fat-sat · sagittal · left · 4.0mm · 0.47mm/px · 5 of 20 slices shown (2 of 2)]
[im 1/20]
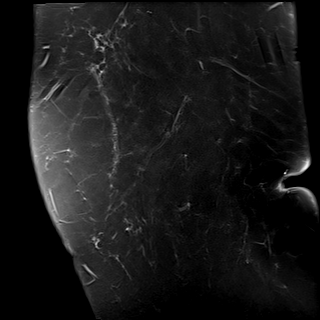
[im 5/20]
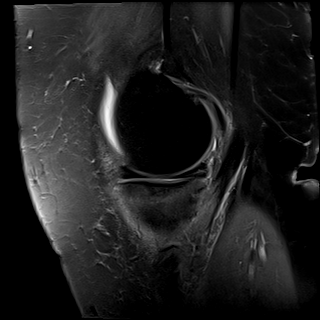
[im 10/20]
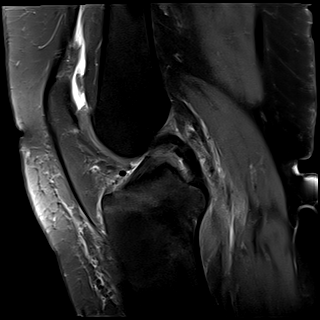
[im 15/20]
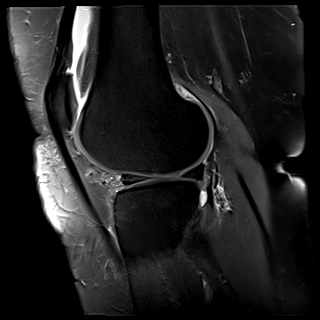
[im 20/20]
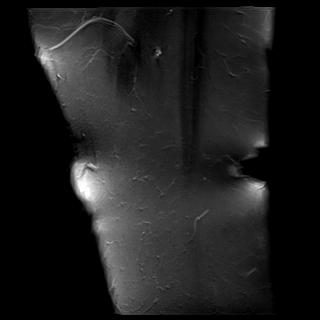

[Series 10: T2 fat-sat · sagittal · left · 4.0mm · 0.47mm/px · 5 of 20 slices shown (3 of 3)]
[im 1/20]
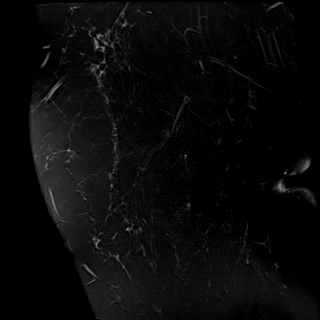
[im 5/20]
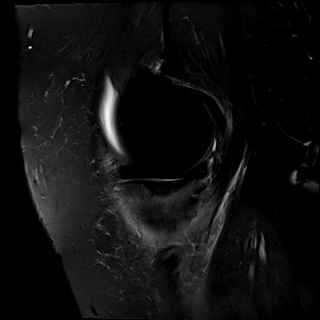
[im 10/20]
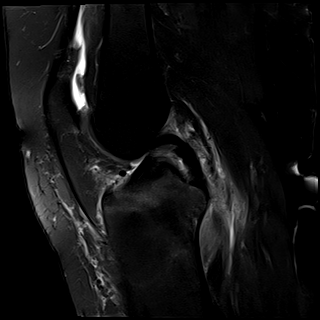
[im 15/20]
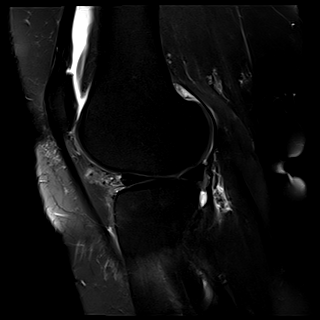
[im 20/20]
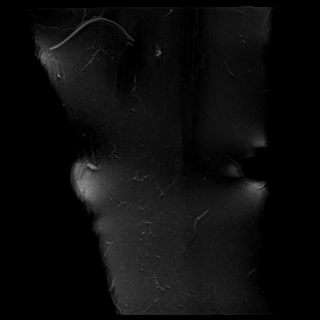

[38 of 40 positions shown; findings below may reference images not displayed]

FINDINGS: MENISCI

Medial meniscus: Radial tear of the posterior horn near the meniscal
root, best seen on coronal image [DATE]. There is mild resulting
peripheral extrusion of the meniscus from the joint space. No
centrally displaced meniscal fragment.

Lateral meniscus: Nearly horizontal tear of the body and anterior
horn of the lateral meniscus with associated small parameniscal
cysts peripherally. No centrally displaced meniscal fragment.

LIGAMENTS

Cruciates:  Intact.

Collaterals: Intact. There is a moderate amount of fluid in the pes
anserine bursa.

CARTILAGE

Patellofemoral: Mild thinning of the patellar cartilage over the
medial facet without focal chondral defect.

Medial:  Mild chondral thinning and surface irregularity.

Lateral:  Mild chondral thinning and surface irregularity.

MISCELLANEOUS

Joint:  Moderate-sized joint effusion.

Popliteal Fossa: No significant Baker's cyst. There is moderate
edema posteriorly around the popliteus muscle.

Extensor Mechanism:  Intact.

Bones: Subchondral insufficiency fracture of the medial tibial
plateau with prominent associated bone marrow edema. As above, there
is surrounding soft tissue edema with fluid in the pes anserine
bursa.

Other: No other significant periarticular soft tissue findings.
IMPRESSION: 1. Stress fracture of the medial tibial plateau with associated
marrow and surrounding soft tissue edema. This likely contributes to
the patient's acute symptoms.
2. Radial tear of the posterior horn of the medial meniscus.
3. Nearly horizontal tear of the body and anterior horn of the
lateral meniscus with associated small parameniscal cysts.
4. Mild tricompartmental degenerative changes.
5. Intact knee ligaments.

## 2021-02-22 ENCOUNTER — Ambulatory Visit: Payer: Medicare Other | Admitting: Cardiology

## 2021-02-22 ENCOUNTER — Encounter: Payer: Self-pay | Admitting: Cardiology

## 2021-02-22 ENCOUNTER — Other Ambulatory Visit: Payer: Self-pay

## 2021-02-22 VITALS — BP 135/80 | HR 88 | Resp 16 | Ht 65.0 in | Wt 195.2 lb

## 2021-02-22 DIAGNOSIS — R002 Palpitations: Secondary | ICD-10-CM

## 2021-02-22 DIAGNOSIS — I10 Essential (primary) hypertension: Secondary | ICD-10-CM

## 2021-02-22 DIAGNOSIS — E6609 Other obesity due to excess calories: Secondary | ICD-10-CM

## 2021-02-22 DIAGNOSIS — E7800 Pure hypercholesterolemia, unspecified: Secondary | ICD-10-CM

## 2021-02-22 DIAGNOSIS — R7303 Prediabetes: Secondary | ICD-10-CM

## 2021-02-22 MED ORDER — ATORVASTATIN CALCIUM 10 MG PO TABS: 10.0000 mg | ORAL_TABLET | Freq: Every evening | ORAL | 1 refills | Status: DC

## 2021-02-22 NOTE — Progress Notes (Signed)
ID:  Lauren [name], Lauren [name] 11, 1957, MRN [id number]  PCP:  Michael Boston, MD  Cardiologist:  Rex Kras, DO, [facility] (established care 12/31/2019)  Date: 02/22/21 Last Office Visit: 08/24/2020   Chief Complaint  Patient presents with   Palpitations   Hyperlipidemia   Follow-up    HPI  Lauren [name] is a 65 y.o. female who presents to the office with a chief complaint of " 6 month reevaluation of shortness of breath/palpitations." Patient's past medical history and cardiovascular risk factors include: Prediabetes, mixed hyperlipidemia, obesity due to excess calories, postmenopausal female, advanced age.  Patient has been referred to the office back in June 2021 for management of shortness of breath.  In the work-up patient has undergone an extensive cardiovascular testing including echocardiogram, coronary calcium score, and stress test.  Results reviewed during today's encounter noted below for further reference.  With regards to her palpitations she was started on Cardizem which resolved her symptoms; however, in the recent past she has been experiencing symptoms suggestive of essential tremor.  Therefore patient states that her PCP has transitioned her from Cardizem to propranolol.  Patient states that her symptoms were better controlled on Cardizem.   Patient denies any chest pain or shortness of breath at rest or with effort related activities.  She is increased her physical activity to approximately 10,000 steps per day and has lost approximately 5 pounds since last visit.    ALLERGIES: Allergies  Allergen Reactions   Codeine Itching   Sulfa Antibiotics Diarrhea and Nausea And Vomiting    MEDICATION LIST PRIOR TO VISIT: Current Meds  Medication Sig   atorvastatin (LIPITOR) 10 MG tablet Take 1 tablet (10 mg total) by mouth at bedtime.   buPROPion (WELLBUTRIN XL) 300 MG 24 hr tablet Take 300 mg by mouth daily.   Cholecalciferol 25 MCG (1000 UT) capsule Take 1 capsule  by mouth daily.   fexofenadine (ALLEGRA) 180 MG tablet Take 1 tablet by mouth daily at 12 noon.   furosemide (LASIX) 20 MG tablet Take 1 tablet by mouth as needed.   metFORMIN (GLUCOPHAGE-XR) 500 MG 24 hr tablet Take 2 tablets by mouth daily.   propranolol (INDERAL) 10 MG tablet Take 10 mg by mouth daily.   [DISCONTINUED] atorvastatin (LIPITOR) 20 MG tablet TAKE 1 TABLET BY MOUTH EVERY NIGHT AT BEDTIME     PAST MEDICAL HISTORY: Past Medical History:  Diagnosis Date   Carcinoid tumor 2007   Depression    Hyperlipidemia    Hypertension    Obesity    Prediabetes     PAST SURGICAL HISTORY: Past Surgical History:  Procedure Laterality Date   TOTAL ABDOMINAL HYSTERECTOMY  2007    FAMILY HISTORY: The patient family history includes Bone cancer in her father; Brain cancer in her father; Breast cancer in her mother; Lung cancer in her mother.  SOCIAL HISTORY:  The patient  reports that she has never smoked. She has never used smokeless tobacco. She reports that she does not drink alcohol and does not use drugs.  REVIEW OF SYSTEMS: Review of Systems  Constitutional: Negative for chills, fever and malaise/fatigue.  HENT:  Negative for hoarse voice and nosebleeds.   Eyes:  Negative for discharge, double vision and pain.  Cardiovascular:  Positive for leg swelling (chronic and stable. ). Negative for chest pain, claudication, dyspnea on exertion, near-syncope, orthopnea, palpitations, paroxysmal nocturnal dyspnea and syncope.  Respiratory:  Negative for hemoptysis and shortness of breath.   Musculoskeletal:  Negative for  muscle cramps and myalgias.  Gastrointestinal:  Negative for abdominal pain, constipation, diarrhea, hematemesis, hematochezia, melena, nausea and vomiting.  Neurological:  Negative for dizziness and light-headedness.   PHYSICAL EXAM: Vitals with BMI 02/22/2021 01/15/2021 01/15/2021  Height _0  - -  Weight 195 lbs 3 oz - -  BMI 16.10 - -  Systolic 960 - 454  Diastolic  80 - 60  Pulse 88 80 81   CONSTITUTIONAL: Well-developed and well-nourished. No acute distress.  SKIN: Skin is warm and dry. No rash noted. No cyanosis. No pallor. No jaundice HEAD: Normocephalic and atraumatic.  EYES: No scleral icterus MOUTH/THROAT: Moist oral membranes.  NECK: No JVD present. No thyromegaly noted. No carotid bruits  LYMPHATIC: No visible cervical adenopathy.  CHEST Normal respiratory effort. No intercostal retractions  LUNGS: Clear to auscultation bilaterally.  No stridor. No wheezes. No rales.  CARDIOVASCULAR: Regular rate and rhythm, positive U9-W1, soft holosystolic murmur heard at apex, no gallops or rubs. ABDOMINAL: No apparent ascites.  EXTREMITIES: No peripheral edema  HEMATOLOGIC: No significant bruising NEUROLOGIC: Oriented to person, place, and time. Nonfocal. Normal muscle tone.  PSYCHIATRIC: Normal mood and affect. Normal behavior. Cooperative  CARDIAC DATABASE: EKG: 02/22/2021: Normal sinus rhythm, 99 bpm, without underlying ischemia or injury pattern.  Echocardiogram: 01/06/2020:  Normal LV systolic function with visual EF 55-60%. Left ventricle cavity is normal in size. Mild left ventricular hypertrophy. Normal global wall motion. Indeterminate diastolic filling pattern, normal LAP.  Left atrial cavity is mildly dilated.  Mild (Grade I) mitral regurgitation.  IVC is dilated with a respiratory response of <50%.  No prior study for comparison.   Stress Testing: Exercise Myoview stress test 01/24/2020:  Exercise nuclear stress test was performed using Bruce protocol. Patient reached 7 METS, and 96% of age predicted maximum heart rate. Exercise capacity was low. Chest pain not reported. Heart rate and hemodynamic response were normal. Stress EKG revealed no ischemic changes.  Normal myocardial perfusion. Stress LVEF 67%. Low risk study,  Coronary artery calcification scoring performed on 01/05/2020 at [facility]:  Total calcium score: 0 AU   Incidental finding of a 29 mm left lobe liver cyst.    Heart Catheterization: None  LABORATORY DATA:  External Labs: Collected: 12/30/2019 Hemoglobin 14.6 g/dL Creatinine 0.73 mg/dL. eGFR: 86 mL/min per 1.73 m Potassium: 4.2 Lipid profile: Total cholesterol 276, triglycerides 180, HDL 74, LDL 167 TSH: 3.485  ProBNP: 18pg/mL  IMPRESSION:    ICD-10-CM   1. Benign hypertension  I10 EKG 12-Lead    2. Prediabetes  R73.03     3. Palpitations  R00.2     4. Hypercholesteremia  E78.00 atorvastatin (LIPITOR) 10 MG tablet    5. Class 1 obesity due to excess calories without serious comorbidity with body mass index (BMI) of 33.0 to 33.9 in adult  E66.09    Z68.33        RECOMMENDATIONS: Ivi Griffith is a 65 y.o. female whose past medical history and cardiac risk factors include: Prediabetes, mixed hyperlipidemia, obesity due to excess calories, postmenopausal female, advanced age.  Palpitations:  Tolerated Cardizem 120 mg p.o. daily while without any side effects or intolerances and had complete resolution of her symptoms.   However, during her follow-up visits with PCP she was having symptoms which she describes as essential tremor and her Cardizem was transitioned to propranolol.  Her symptoms are still better controlled but at times she does feel palpitations.   I asked her to discuss this further with her PCP and  the dose of propranolol can be increased.   Benign essential hypertension:  Office and home blood pressure is currently at goal. Medications reconciled. Low-salt diet recommended.  Prediabetes and mixed hyperlipidemia: In the past patient was noted to have hyperlipidemia, and her estimated 10-year risk of ASCVD was approximately 8% and therefore statin therapy was recommended.  She did well with Lipitor 20 mg p.o. nightly however since improvement in her lipids the shared decision was to reduce it to 10 mg p.o. daily.   She is tolerating statin therapy well  without any side effects or intolerances.   Most recent lipid profile from April 2022 independently reviewed and her LDL is 71 mg/dL.   Is also implemented lifestyle changes and tries to ambulate at least 10,000 steps a day and has lost 5 pounds since last office visit for which she is congratulated for at today's encounter.    During her coronary calcium scan back in June 2021 she was noted to have an incidental finding of a 29 mm left lobe liver cyst.  I have asked her to follow-up with her PCP regarding this.  However, patient states that she already follows an oncologist for her neuroendocrine tumors and she believes there is one in the liver that they following longitudinally.  I have asked her to discuss this further with her oncologist.  FINAL MEDICATION LIST END OF ENCOUNTER: Meds ordered this encounter  Medications   atorvastatin (LIPITOR) 10 MG tablet    Sig: Take 1 tablet (10 mg total) by mouth at bedtime.    Dispense:  90 tablet    Refill:  1     Current Outpatient Medications:    atorvastatin (LIPITOR) 10 MG tablet, Take 1 tablet (10 mg total) by mouth at bedtime., Disp: 90 tablet, Rfl: 1   buPROPion (WELLBUTRIN XL) 300 MG 24 hr tablet, Take 300 mg by mouth daily., Disp: , Rfl:    Cholecalciferol 25 MCG (1000 UT) capsule, Take 1 capsule by mouth daily., Disp: , Rfl:    fexofenadine (ALLEGRA) 180 MG tablet, Take 1 tablet by mouth daily at 12 noon., Disp: , Rfl:    furosemide (LASIX) 20 MG tablet, Take 1 tablet by mouth as needed., Disp: , Rfl:    metFORMIN (GLUCOPHAGE-XR) 500 MG 24 hr tablet, Take 2 tablets by mouth daily., Disp: , Rfl:    propranolol (INDERAL) 10 MG tablet, Take 10 mg by mouth daily., Disp: , Rfl:   Orders Placed This Encounter  Procedures   EKG 12-Lead    There are no Patient Instructions on file for this visit.  Months --Continue cardiac medications as reconciled in final medication list. --Return in about 14 months (around 04/29/2022) for Follow up,  Palpitations. Or sooner if needed. --Continue follow-up with your primary care physician regarding the management of your other chronic comorbid conditions.  Patient's questions and concerns were addressed to her satisfaction. She voices understanding of the instructions provided during this encounter.   This note was created using a voice recognition software as a result there may be grammatical errors inadvertently enclosed that do not reflect the nature of this encounter. Every attempt is made to correct such errors.  Total time spent: 34 minutes.  Rex Kras, [place], [facility]  Pager: [phone] Office: [phone]

## 2021-03-05 ENCOUNTER — Other Ambulatory Visit: Payer: Self-pay | Admitting: Rehabilitation

## 2021-03-05 DIAGNOSIS — M4316 Spondylolisthesis, lumbar region: Secondary | ICD-10-CM

## 2021-03-06 ENCOUNTER — Other Ambulatory Visit: Payer: Self-pay | Admitting: Cardiology

## 2021-03-24 ENCOUNTER — Other Ambulatory Visit: Payer: Self-pay

## 2021-03-24 ENCOUNTER — Ambulatory Visit
Admission: RE | Admit: 2021-03-24 | Discharge: 2021-03-24 | Disposition: A | Discharge status: Home / Self Care | Payer: Medicare Other | Source: Ambulatory Visit | Attending: Rehabilitation | Admitting: Rehabilitation

## 2021-03-24 DIAGNOSIS — M4316 Spondylolisthesis, lumbar region: Secondary | ICD-10-CM

## 2021-03-24 IMAGING — MR MR LUMBAR SPINE W/O CM
4 of 5 series · 20 of 48 positions shown · non-contrast
Comparison: None.

CLINICAL DATA: Spondylolisthesis of lumbar spine. Low back pain
with bilateral hip pain. History of small bowel carcinoid

EXAM:
MRI LUMBAR SPINE WITHOUT CONTRAST
TECHNIQUE: Multiplanar, multisequence MR imaging of the lumbar spine was
performed. No intravenous contrast was administered.

[Series 5: T2 · sagittal · 4.0mm · 0.73mm/px · 6 of 17 slices shown (1 of 2)]
[im 1/17]
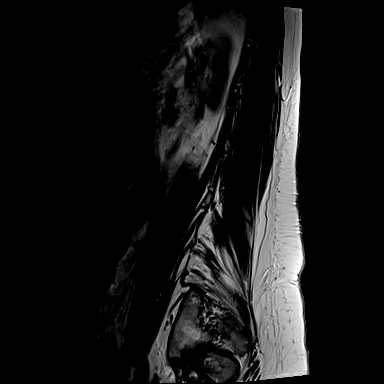
[im 4/17]
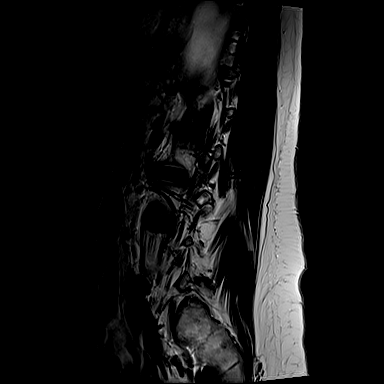
[im 7/17]
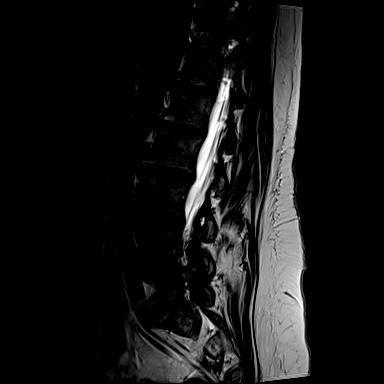
[im 10/17]
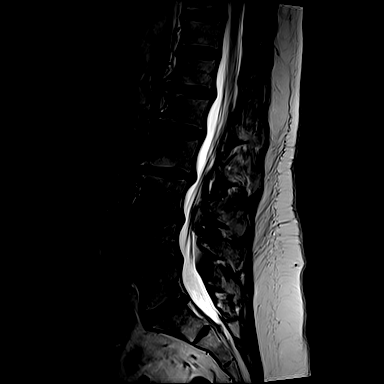
[im 13/17]
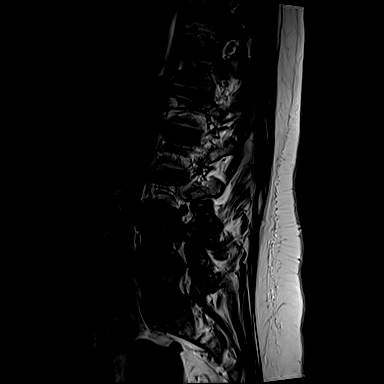
[im 17/17]
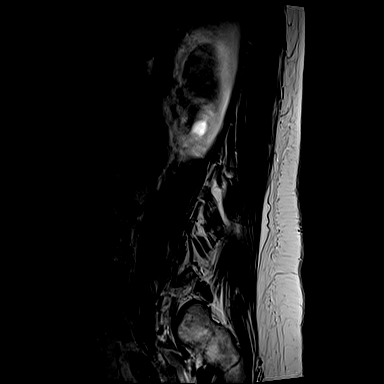

[Series 6: T1 · sagittal · 4.0mm · 0.73mm/px · 3 of 17 slices shown (1 of 2)]
[im 4/17]
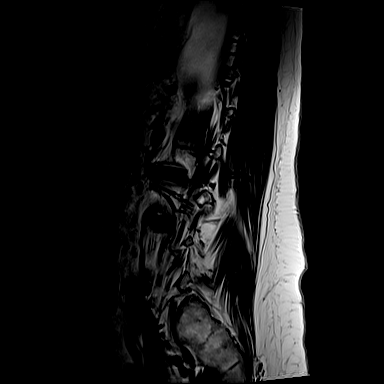
[im 10/17]
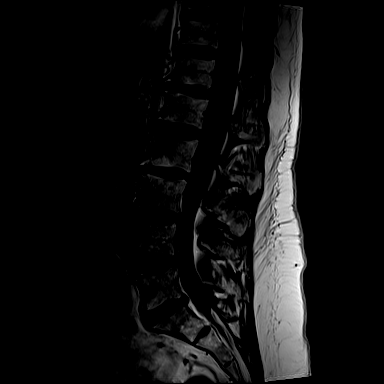
[im 17/17]
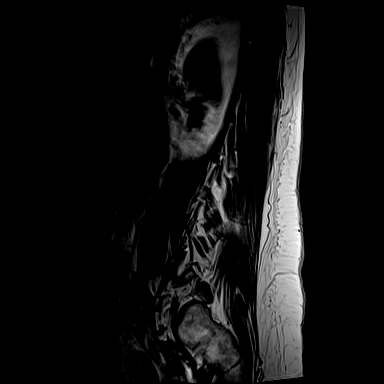

[Series 10: T1 · axial · 4.0mm · 0.28mm/px · z∈[-25,+127]mm · 3 of 41 slices shown (2 of 2)]
[im 6/41]
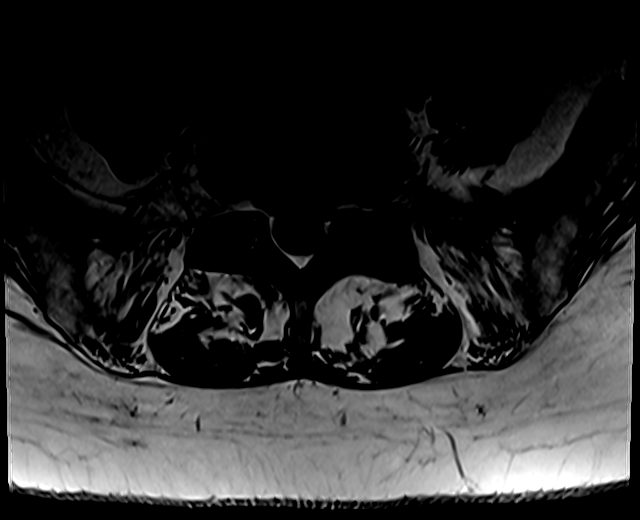
[im 21/41]
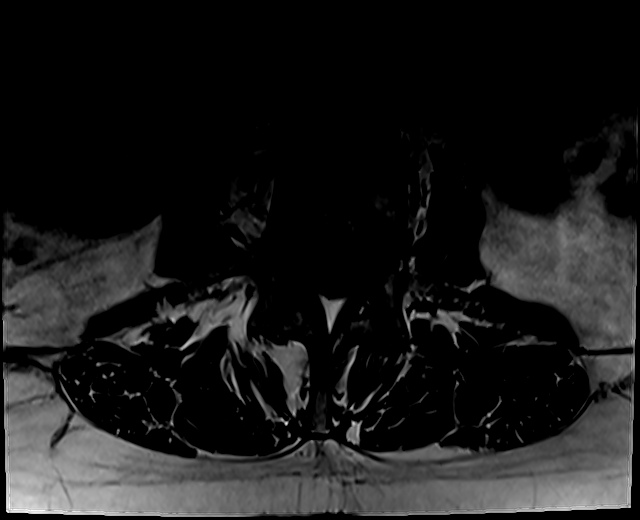
[im 35/41]
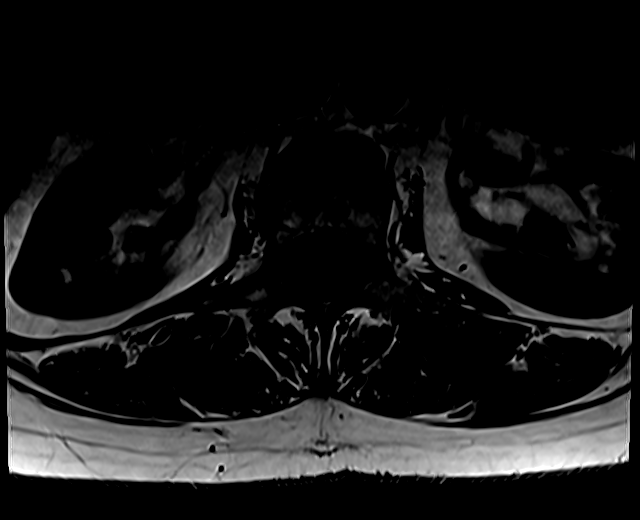

[Series 13: T2 · axial · 4.0mm · 0.28mm/px · z∈[-50,+127]mm · 8 of 41 slices shown (2 of 2)]
[im 1/41]
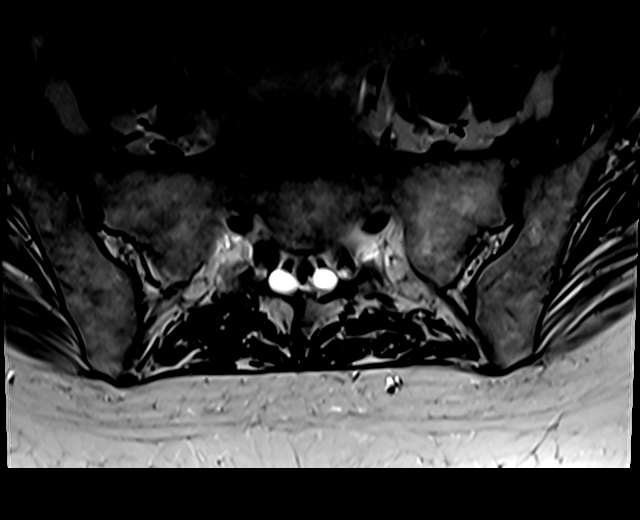
[im 6/41]
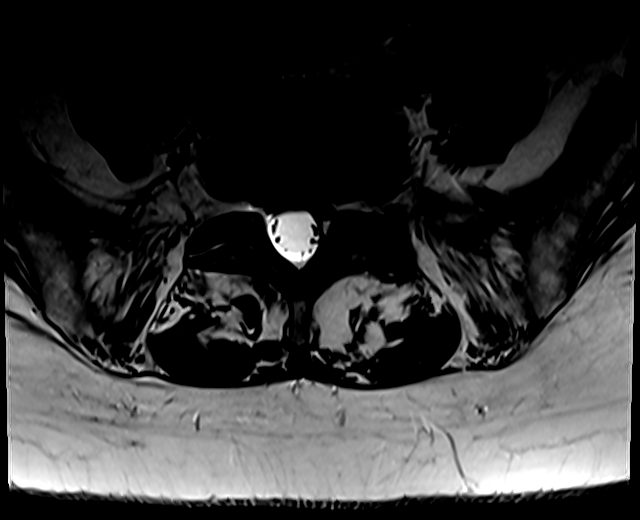
[im 12/41]
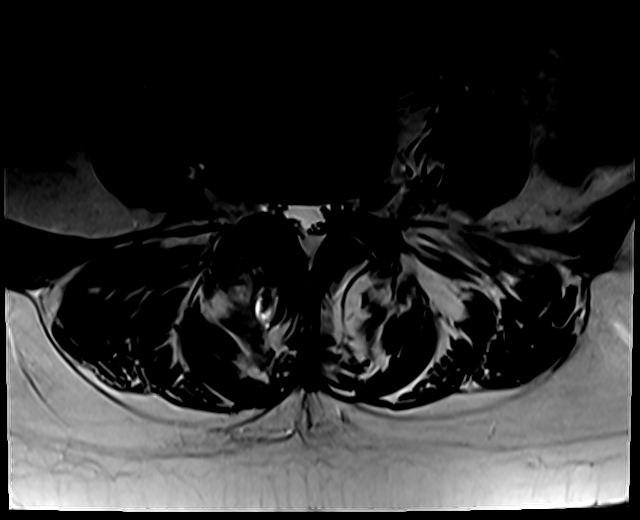
[im 18/41]
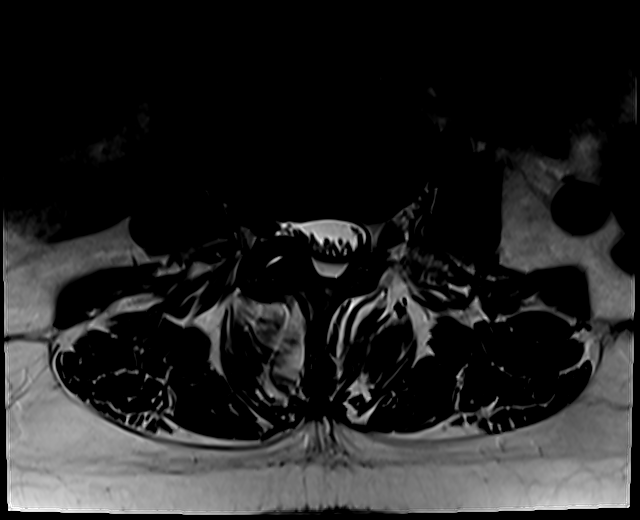
[im 21/41]
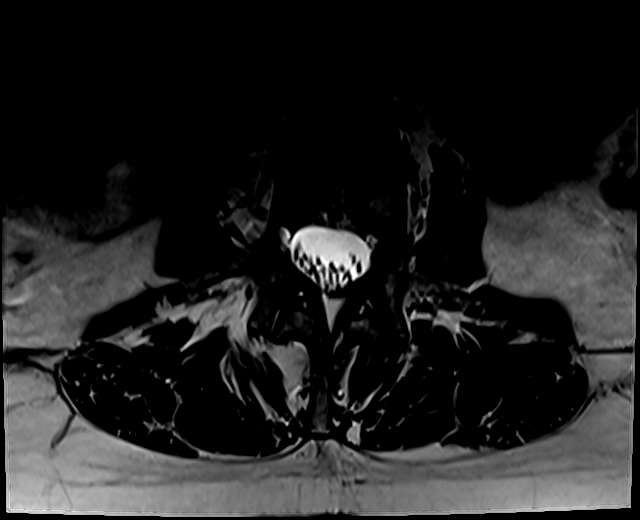
[im 23/41]
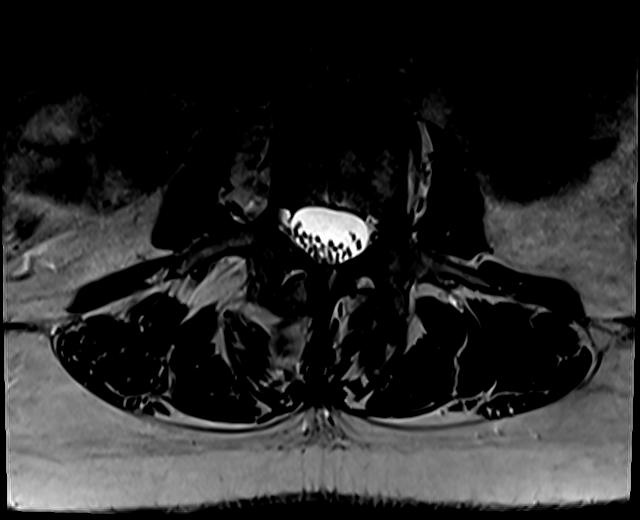
[im 29/41]
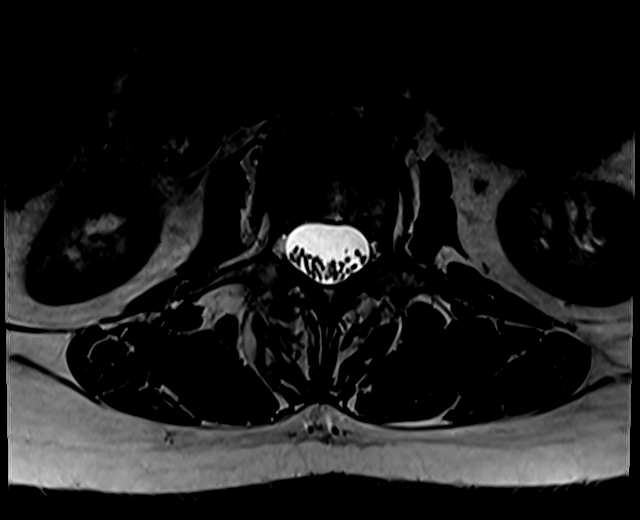
[im 35/41]
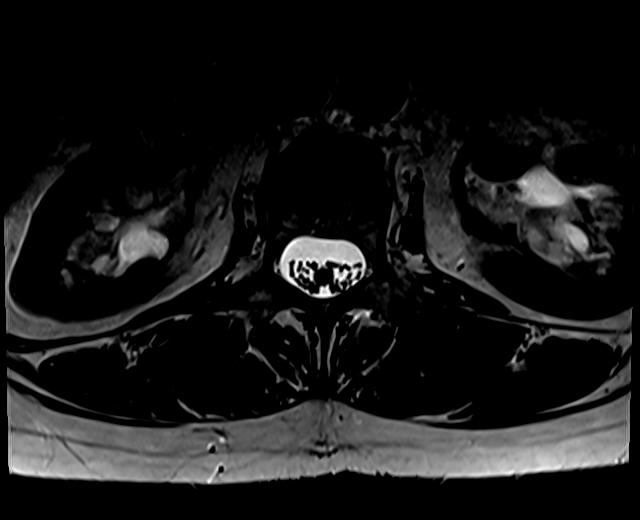

[20 of 48 positions shown; findings below may reference images not displayed]

FINDINGS: Segmentation:  5 lumbar type vertebrae.

Alignment: Slight anterolisthesis at T12-L1 and L4-5. Mild scoliosis

Vertebrae:  No fracture, evidence of discitis, or bone lesion.

Conus medullaris and cauda equina: Conus extends to the L1-2 level.
Conus and cauda equina appear normal.

Paraspinal and other soft tissues: Renal sinus cysts. Large
descending colonic diverticula versus haustration. No perispinal
mass or inflammation noted.

Disc levels:

T12- L1: Small central disc protrusion. Ventral spondylitic spurring

L1-L2: Unremarkable.

L2-L3: Disc narrowing and bulging bilateral paracentral protrusion
appearance on axial images

L3-L4: Disc narrowing and bulging with small right inferior
foraminal protrusion. Facet osteoarthritis which is prominent on the
right, including a posterior synovial cyst. No neural compression

L4-L5: Facet spurring and disc bulging with right foraminal annular
fissure.

L5-S1:Disc narrowing and bulging. Facet spurring asymmetric to the
left. Moderate left foraminal stenosis.
IMPRESSION: 1. Generalized lumbar spine degeneration with mild scoliosis and
listhesis.
2. Up to moderate foraminal narrowing on the left at L5-S1.
3. Diffusely patent spinal canal.

## 2021-06-12 ENCOUNTER — Telehealth: Payer: Self-pay | Admitting: Gastroenterology

## 2021-06-12 NOTE — Telephone Encounter (Signed)
Hi Dr. Fuller Plan, (DoD 06/12/21, p.m.)  This patient would like to transfer her care to our office.  She was originally from [place], moved to [facility], and in the last few years has moved to this area and would like to be seen by our office.  Her records are being sent to you for your review.  Please let me know if you approve the transfer of care.  Thank you.

## 2021-06-13 ENCOUNTER — Encounter: Payer: Self-pay | Admitting: Gastroenterology

## 2021-06-13 NOTE — Telephone Encounter (Signed)
OK to transfer her care to [facility] however I do not have the capacity to accept at this time. Drs. Candis Schatz and Lorenso Courier have more availability so the patient can schedule an appt with [place] or [facility].

## 2021-06-27 ENCOUNTER — Ambulatory Visit: Payer: Medicare Other | Admitting: Gastroenterology

## 2021-07-15 DIAGNOSIS — H269 Unspecified cataract: Secondary | ICD-10-CM

## 2021-07-15 HISTORY — DX: Unspecified cataract: H26.9

## 2021-07-31 ENCOUNTER — Other Ambulatory Visit: Payer: Self-pay

## 2021-07-31 DIAGNOSIS — E7800 Pure hypercholesterolemia, unspecified: Secondary | ICD-10-CM

## 2021-07-31 MED ORDER — ATORVASTATIN CALCIUM 10 MG PO TABS: 10.0000 mg | ORAL_TABLET | Freq: Every evening | ORAL | 1 refills | Status: DC

## 2021-08-13 ENCOUNTER — Ambulatory Visit: Payer: Medicare Other | Admitting: Gastroenterology

## 2021-09-04 ENCOUNTER — Encounter: Payer: Self-pay | Admitting: Gastroenterology

## 2021-09-04 ENCOUNTER — Ambulatory Visit (INDEPENDENT_AMBULATORY_CARE_PROVIDER_SITE_OTHER): Payer: Medicare Other | Admitting: Gastroenterology

## 2021-09-04 VITALS — BP 116/86 | HR 68 | Ht 65.0 in | Wt 199.6 lb

## 2021-09-04 DIAGNOSIS — C7A8 Other malignant neuroendocrine tumors: Secondary | ICD-10-CM

## 2021-09-04 DIAGNOSIS — Z85060 Personal history of malignant carcinoid tumor of small intestine: Secondary | ICD-10-CM

## 2021-09-04 NOTE — Progress Notes (Signed)
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 Cristie Hem to establish GI care.  Previously, the patient had been followed by [facility] gastroenterology and oncology, but would prefer to transfer her care to [facility], as the patient lives in [place] and traveling to [place] for these medical visits is inconvenient. The patient's oncologic history below is taken from her last oncology note at [facility] (by Dr. Frederico Hamman). The patient's last colonoscopy was done at [facility] in November 2018, it was notable for a 9 mm tubular adenoma removed from the cecum (colonoscopy and pathology reports available in EMR).  The patient denies having larger numerous polyps on previous colonoscopies, although these other reports were not available for review at this time. The patient denies any significant GI symptoms.  She does report some decreased frequency of bowel movements, having a bowel movement about every 2 to 3 days.  She attributes this change in bowel habits to changes in her physical activity, which she says is secondary to worsening arthritis in her knee.  She is anticipating having a total knee replacement in May of this year.  To help with her constipation, she is started drinking more prune juice and drinking more water.  She does not take any laxatives.  She denies any blood in her stool.  No problems with straining.  She has lost 15 pounds in the last year through improvements in her diet (primarily eating less carbs).  She denies any family history of colon cancer.  She reports that her paternal grandfather was diagnosed with stomach cancer. She has very rare symptoms of heartburn which are typically triggered by problem foods.  Otherwise, she denies any chronic  upper GI symptoms. She has no known cardiopulmonary comorbidities, and denies any active cardiopulmonary symptoms such as chest pain/pressure, shortness of breath, palpitations, lightheadedness/dizziness.     Oncologic history borrowed from Dr. Carolynne Edouard as below:  Lauren Simmer, MD - 11/17/2020 2:30 PM EDT Formatting of this note is different from the original. Service: Hematology and Oncology Patient Name: Lauren [name] Patient Age: 66 y.o. Encounter Date: 11/17/2020  PRIMARY CARE PHYSICIAN [facility] [place] [id number]  CONSULTING PHYSICIANS GI: Dr. 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 - no evidence of recurrence; last dotatate PET March 2020 raised the speculation that previously reported 0.9 cm retroperitoneal nodule on 06/04/19 was likely just misregistered pancreas. Colonoscopy was clean on 06/12/17.  - MRI abdomen pelvis May 2022 and chromogranin stable (NED) - continue: MRI abdomen pelvis annually, colonoscopy next due Nov 2023  - upon concern for progression: plan to repeat dotatate PET and initiate sandostatin   ONCOLOGIC TREATMENT HISTORY  ~ 07/2005: 2-3 years of progressive pelvic pain with spasms around the time of menses triggered a diagnostic laparoscopy  where endometriosis was observed along with nodularity involving 15 - 20 cm of small bowel. ~ 12/05/2005: OR for  debulking of endometriosis where TAH, BSO, partial colpectomy, and appendectomy was performed. Exploration of the small bowel revealed "multiple lesions present" - patient reports being told ~ 23; one was excised for pathology. Pathology confirmed the expected adenomyosis plus demonstrated an incidental 2 mm well differentiated neuroendocrine tumor at the tip of the appendix within the submucosa, and the small bowel excisional biopsy also showed a small focus of neuroendocrine tumor surrounded by plasmacytic infiltrate with associated microcalcifications. ~ 01/08/06: Evaluated by medical and surgical oncology. No carcinoid symptoms. 24 hr urine 5-HIAA WNLs at 4.0 with mildly elevated chromogranin A at 285. Octreotide scan negative - unable to visualize residual disease. Surgery not recommended given the diffuse nature and lack of symptoms. Watchful waiting program initiated which she has continued since without evidence of progression.  - Periodic CT enterography have all been negative with most recent 07/31/2009 ~ 04/07/17: Tumor marker Chromogranin A WNLs at 84 (has remained normal since that time) ~ 06/03/17: Staging Gallium-68 dotatate NET PET at [facility] highlights just one 0.9 cm retroperitoneal nodule with no other sites of active disease seen ~ 06/12/17: Colonoscopy negative for carcinoid involvement ~ 09/24/18: Follow-up Gallium-68 dotatate NET PET clean, believing prior reported 0.9 cm retroperitoneal nodule was likely just misregistered pancreas ~ 11/19/19: Surveillance MRI a/p with NED    Past Medical History:  Diagnosis Date   Benign carcinoid tumor of small intestine 2007   Depression    Diverticulosis of colon    Endometriosis    Essential tremor    GERD (gastroesophageal reflux disease)    Hx of adenomatous polyp of colon 05/2017   Hyperlipidemia    Hypertension    Neuroendocrine  carcinoma of appendix ([place])    Obesity    Osteopenia    Prediabetes   Colonoscopy June 12, 2017: [facility]) 9 mm polyp at the appendiceal orifice, tubular adenoma, recommended repeat in 3 to 5 years, diverticulosis, internal hemorrhoids   Past Surgical History:  Procedure Laterality Date   APPENDECTOMY  11/2005   BUNIONECTOMY     OOPHORECTOMY     TOTAL ABDOMINAL HYSTERECTOMY  2007   Family History  Problem Relation Age of Onset   Breast cancer Mother    Lung cancer Mother    Alcoholism Mother    Arthritis Mother    Bone cancer Father    Brain cancer Father    Hypertension Father    Lung cancer Father    Social History   Tobacco Use   Smoking status: Never   Smokeless tobacco: Never  Vaping Use   Vaping Use: Never used  Substance Use Topics   Alcohol use: Not Currently   Drug use: Never   Current Outpatient Medications  Medication Sig Dispense Refill   alendronate (FOSAMAX) 70 MG tablet 70 mg once a week.     atorvastatin (LIPITOR) 10 MG tablet Take 1 tablet (10 mg total) by mouth at bedtime. 90 tablet 1   b complex vitamins capsule Take 1 capsule by mouth daily.     Biotin 10 MG CAPS Take 1 tablet by mouth daily.     Cholecalciferol 25 MCG (1000 UT) capsule Take 1 capsule by mouth daily.     fexofenadine (ALLEGRA) 180 MG tablet Take 1 tablet by mouth daily at 12 noon.     furosemide (LASIX) 20 MG tablet Take 1 tablet by mouth as needed.     METFORMIN HCL ER PO Take 1,000 mg by mouth 2 (two) times daily.     Probiotic Product (  PROBIOTIC-10 PO) Take 1 tablet by mouth daily.     propranolol (INDERAL) 10 MG tablet Take 10 mg by mouth daily.     No current facility-administered medications for this visit.   Allergies  Allergen Reactions   Codeine Itching   Sulfa Antibiotics Diarrhea and Nausea And Vomiting     Review of Systems: All systems reviewed and negative except where noted in HPI.    No results found.  Physical Exam: BP 116/86    Pulse 68    Ht 5\' 5"   (1.651 m)    Wt 199 lb 9.6 oz (90.5 kg)    SpO2 97%    BMI 33.22 kg/m  Constitutional: Pleasant,well-developed, Caucasian female in no acute distress. HEENT: Normocephalic and atraumatic. Conjunctivae are normal. No scleral icterus. Cardiovascular: Normal rate, regular rhythm.  Pulmonary/chest: Effort normal and breath sounds normal. No wheezing, rales or rhonchi. Abdominal: Soft, nondistended, nontender. Bowel sounds active throughout. There are no masses palpable. No hepatomegaly. Extremities: no edema Neurological: Alert and oriented to person place and time. Skin: Skin is warm and dry. No rashes noted. Psychiatric: Normal mood and affect. Behavior is normal.  CBC No results found for: WBC, RBC, HGB, HCT, PLT, MCV, MCH, MCHC, RDW, LYMPHSABS, MONOABS, EOSABS, BASOSABS  CMP     Component Value Date/Time   NA 143 10/26/2020 0902   K 4.8 10/26/2020 0902   CL 104 10/26/2020 0902   CO2 23 10/26/2020 0902   GLUCOSE 118 (H) 10/26/2020 0902   BUN 10 10/26/2020 0902   CREATININE 0.77 10/26/2020 0902   CALCIUM 9.2 10/26/2020 0902   PROT 6.0 10/26/2020 0902   ALBUMIN 4.3 10/26/2020 0902   AST 13 10/26/2020 0902   ALT 16 10/26/2020 0902   ALKPHOS 86 10/26/2020 0902   BILITOT 0.2 10/26/2020 0902   GFRNONAA 67 09/08/2020 0920   GFRAA 77 09/08/2020 0920     ASSESSMENT AND PLAN: 66 year old female with history of stage IV neuroendocrine tumor, thought to arise from the appendix with extensive small bowel mets, status post resection of appendiceal tumor, not requiring systemic therapy given absence of symptoms and stable disease, referred for surveillance colonoscopy.  Her last colonoscopy was in November 2018 and a 9 mm tubular adenoma was removed.  She was recommended repeat in 3 to 5 years.  She is requesting oncology referral to transfer her oncology care from [facility] to [facility].  She has no concerning GI symptoms.  She has an upcoming knee arthroplasty in May. I told her I do not  think that her surveillance colonoscopy is urgent and needs to be completed before her knee surgery.  I think she should be fine to wait until later this year for surveillance colonoscopy.  We will place a referral to our gastrointestinal oncology colleagues at [facility].  Stage IV nonfunctioning appendiceal neuroendocrine tumor with diffuse small bowel involvement - Referral to GI oncology for ongoing surveillance/consideration of treatment - Surveillance colonoscopy planned for November of this year  Ricci Dirocco E. Candis Schatz, MD [facility]  CC:  Michael Boston, MD

## 2021-09-04 NOTE — Patient Instructions (Signed)
If you are age 68 or older, your body mass index should be between 23-30. Your Body mass index is 33.22 kg/m. If this is out of the aforementioned range listed, please consider follow up with your Primary Care Provider.  If you are age 40 or younger, your body mass index should be between 19-25. Your Body mass index is 33.22 kg/m. If this is out of the aformentioned range listed, please consider follow up with your Primary Care Provider.   We have sent a referral to Oncology and they will contact you to schedule an appointment.  The [facility] providers would like to encourage you to use [facility] to communicate with providers for non-urgent requests or questions.  Due to long hold times on the telephone, sending your provider a message by [facility] may be a faster and more efficient way to get a response.  Please allow 48 business hours for a response.  Please remember that this is for non-urgent requests.   It was a pleasure to see you today!  Thank you for trusting me with your gastrointestinal care!    Scott E.Candis Schatz, MD

## 2021-09-05 ENCOUNTER — Encounter: Payer: Self-pay | Admitting: Gastroenterology

## 2021-10-03 ENCOUNTER — Inpatient Hospital Stay: Payer: Medicare Other | Admitting: Oncology

## 2021-10-05 ENCOUNTER — Other Ambulatory Visit: Payer: Self-pay

## 2021-10-05 ENCOUNTER — Inpatient Hospital Stay: Payer: Medicare Other | Attending: Oncology | Admitting: Oncology

## 2021-10-05 VITALS — BP 138/55 | HR 74 | Temp 97.8°F | Resp 18 | Ht 65.0 in | Wt 200.0 lb

## 2021-10-05 DIAGNOSIS — C7A098 Malignant carcinoid tumors of other sites: Secondary | ICD-10-CM | POA: Diagnosis not present

## 2021-10-05 DIAGNOSIS — M8580 Other specified disorders of bone density and structure, unspecified site: Secondary | ICD-10-CM | POA: Insufficient documentation

## 2021-10-05 DIAGNOSIS — E785 Hyperlipidemia, unspecified: Secondary | ICD-10-CM | POA: Insufficient documentation

## 2021-10-05 DIAGNOSIS — C7A020 Malignant carcinoid tumor of the appendix: Secondary | ICD-10-CM | POA: Insufficient documentation

## 2021-10-05 DIAGNOSIS — K219 Gastro-esophageal reflux disease without esophagitis: Secondary | ICD-10-CM | POA: Insufficient documentation

## 2021-10-05 DIAGNOSIS — I10 Essential (primary) hypertension: Secondary | ICD-10-CM | POA: Insufficient documentation

## 2021-10-05 NOTE — Progress Notes (Signed)
?Lauren [name] ?New Patient Consult ? ? ?Requesting MD: ?Daryel November, Md ?[address] ?[place],  [place] [address] ? ? ?Lauren [name] ?66 y.o.  ?01-Dec-1955 ? ?  ?Reason for Consult: Appendix carcinoid tumor ? ? 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  She was initially followed at the [facility].  She has received no specific treatment and has been followed with serial imaging studies. ?She relocated to [facility] in 2018 and has been followed by the medical oncology service there.  The chromogranin A level was mildly elevated in 2007.  No residual disease on an octreotide scan, though she reports greater than 20 small bowel lesions were noted at the time of surgery. ?She has been followed with chromogranin a levels and serial imaging studies at [facility].  There was no evidence of recurrent disease on a dotatate PET March 2020 with a previously noted retroperitoneal nodule felt to represent misregistration from the pancreas.  A colonoscopy on 06/12/2017 revealed no carcinoid tumor.  An MRI of the abdomen and pelvis in May 2022 revealed no active disease. ? ?She recently relocated to [facility] and is referred for follow-up of the carcinoid tumor.  She is seeing Dr. Candis Schatz for colonoscopy surveillance. ? ?Lauren [name] reports feeling well at present.  No flushing or diarrhea.  No abdominal pain.  She is scheduled to undergo knee replacement surgery in May. ? ?Past Medical History:  ?Diagnosis Date  ? carcinoid tumor of appendix with carcinomatosis 2007  ? Depression   ? Diverticulosis of colon   ? Endometriosis   ? Essential tremor   ? GERD (gastroesophageal reflux disease)   ? Hx of adenomatous polyp of colon 05/2017  ? Hyperlipidemia   ? Hypertension   ? 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 Obesity   ?  Osteopenia   ? Prediabetes   ?  .  G1, P0, 1 miscarriage ?  Marland Kitchen  Cataracts ? ?Past Surgical History:  ?Procedure Laterality Date  ? APPENDECTOMY  11/2005  ? BUNIONECTOMY    ? OOPHORECTOMY    ? TOTAL ABDOMINAL HYSTERECTOMY  2007  ? ? ?Medications: Reviewed ? ?Allergies:  ?Allergies  ?Allergen Reactions  ? Codeine Itching  ? Sulfa Antibiotics Diarrhea and Nausea And Vomiting  ? ? ?Family history: Her father died of small cell lung cancer and was a smoker.  Her mother had breast cancer and lung cancer.  A brother had head neck cancer and lung cancer and was a smoker.  A brother had Hodgkin's disease. ? ?Social History:  ? ?She lives with her husband in [place].  She is a retired [employer] scientist.  She has received COVID-19 and influenza vaccines.  No cigarette or alcohol use.  No transfusion history.  No risk factor for HIV or hepatitis. ? ?ROS:  ? ?Positives include: 20 pound intentional weight loss, chronic right lower back pain, left knee pain-knee replacement surgery scheduled ? ?A complete ROS was otherwise negative. ? ?Physical Exam: ? ?Blood pressure (!) 138/55, pulse 74, temperature 97.8 ?F (36.6 ?C), temperature source Oral, resp. rate 18, height '5\' 5"'$  (1.651 m), weight 200 lb (90.7 kg), SpO2 100 %. ? ?HEENT: Neck without mass ?Lungs: Clear bilaterally ?Cardiac: Regular rate and rhythm, no murmur ?Abdomen: No hepatosplenomegaly, no mass, nontender  ?Vascular: No leg edema ?Lymph nodes: No cervical, supraclavicular,  axillary, or inguinal nodes ?Neurologic: Alert and oriented, the motor exam appears intact in the upper and lower extremities bilaterally ?Skin: No rash ?Musculoskeletal: Mild tenderness at the right posterior iliac, no mass ? ? ? ? ? ?Assessment/Plan:  ? ?Carcinoid tumor of the appendix ?TAH/BSO/debulking of endometriosis, partial colpectomy, and appendectomy 12/05/2005-multiple small bowel lesions present, 2 mm well-differentiated neuroendocrine tumor at the tip of the appendix within the submucosa, small  bowel excisional biopsy with a small focus of neuroendocrine tumor surrounded by plasmacytic infiltrate with associated microcalcifications ?01/08/2006-24-hour urine 5 HIAA-normal, mildly elevated chromogranin A at 285, negative octreotide scan, followed with observation ?Periodic CT enterography negative-most recent 07/31/2009 ?11 /20/2018-dotatate PET at [facility]-one 0.9 cm retroperitoneal nodule with no other site of active disease ?06/12/2017-colonoscopy negative for carcinoid involvement ?09/24/2018-dotatate PET-no active disease, previously reported 0.9 cm red.  Nodule was likely misregistered pancreas ?12/09/2019-surveillance MRI abdomen/pelvis-NED ?11/17/2020-MRI abdomen/pelvis-no evidence of metastatic disease ?Osteopenia ?Hypertension ?Diabetes ?Colonoscopy 06/12/2017-adenomatous polyp removed from the cecum ? ? ?Disposition:  ? ?Lauren [name] was diagnosed with a carcinoid tumor of the appendix in 2007.  She was noted to have carcinomatosis with lesions on the small bowel at presentation.  She remains asymptomatic from the carcinoid tumor and there has been no evidence of progressive disease on serial imaging studies. ? ?She will return for a chromogranin a level in 1-2 weeks.  She will hold omeprazole until the lab visit here. ? ?We discussed the indication for imaging studies.  I see no indication for imaging given the lack of symptoms.  She is in agreement.  She will return for an office visit and chromogranin a level in 1 year. ?She will follow-up with Dr. Candis Schatz f for a colonoscopy later this year. ? ?Betsy Coder, MD  ?10/05/2021, 3:17 PM ? ? ?

## 2021-10-11 ENCOUNTER — Inpatient Hospital Stay: Payer: Medicare Other

## 2021-10-11 DIAGNOSIS — I10 Essential (primary) hypertension: Secondary | ICD-10-CM | POA: Diagnosis not present

## 2021-10-11 DIAGNOSIS — K219 Gastro-esophageal reflux disease without esophagitis: Secondary | ICD-10-CM | POA: Diagnosis not present

## 2021-10-11 DIAGNOSIS — E785 Hyperlipidemia, unspecified: Secondary | ICD-10-CM | POA: Diagnosis not present

## 2021-10-11 DIAGNOSIS — C7A098 Malignant carcinoid tumors of other sites: Secondary | ICD-10-CM

## 2021-10-11 DIAGNOSIS — C7A020 Malignant carcinoid tumor of the appendix: Secondary | ICD-10-CM | POA: Diagnosis present

## 2021-10-11 DIAGNOSIS — M8580 Other specified disorders of bone density and structure, unspecified site: Secondary | ICD-10-CM | POA: Diagnosis not present

## 2021-10-12 LAB — CHROMOGRANIN A: Chromogranin A (ng/mL): 109.3 ng/mL — ABNORMAL HIGH (ref 0.0–101.8)

## 2021-10-15 ENCOUNTER — Telehealth: Payer: Self-pay

## 2021-10-15 NOTE — Telephone Encounter (Signed)
Pt verbalized understanding relayed message about holding omeprazole for 10 days before return lab Pt stated she held the omeprazole for this lab. Discussed with Dr Benay Spice who stated if Pt would like to come in 6 months to repeat the lab she can make an appointment. Pt verbalized understanding. ?

## 2021-10-15 NOTE — Telephone Encounter (Signed)
-----   Message from Ladell Pier, MD sent at 10/13/2021 11:13 AM EDT ----- ?Please call patient, chromogranin is at upper end of normal range, likely benign normal variation, f/u as scheduled, hold omeprazole for 10 days prior to next lab here ? ?

## 2021-11-13 HISTORY — PX: TOTAL KNEE ARTHROPLASTY: SHX125

## 2022-01-24 ENCOUNTER — Other Ambulatory Visit: Payer: Self-pay | Admitting: Cardiology

## 2022-01-24 DIAGNOSIS — E7800 Pure hypercholesterolemia, unspecified: Secondary | ICD-10-CM

## 2022-02-06 HISTORY — PX: CATARACT EXTRACTION: SUR2

## 2022-02-20 ENCOUNTER — Other Ambulatory Visit: Payer: Self-pay | Admitting: Internal Medicine

## 2022-02-20 DIAGNOSIS — Z1231 Encounter for screening mammogram for malignant neoplasm of breast: Secondary | ICD-10-CM

## 2022-02-20 HISTORY — PX: CATARACT EXTRACTION: SUR2

## 2022-03-20 ENCOUNTER — Ambulatory Visit
Admission: RE | Admit: 2022-03-20 | Discharge: 2022-03-20 | Disposition: A | Discharge status: Home / Self Care | Payer: Medicare Other | Source: Ambulatory Visit | Attending: Internal Medicine | Admitting: Internal Medicine

## 2022-03-20 DIAGNOSIS — Z1231 Encounter for screening mammogram for malignant neoplasm of breast: Secondary | ICD-10-CM

## 2022-04-25 ENCOUNTER — Ambulatory Visit: Payer: Medicare Other | Admitting: Cardiology

## 2022-04-25 ENCOUNTER — Encounter: Payer: Self-pay | Admitting: Cardiology

## 2022-04-25 VITALS — BP 109/70 | HR 72 | Temp 97.7°F | Resp 16 | Ht 65.0 in | Wt 198.0 lb

## 2022-04-25 DIAGNOSIS — R002 Palpitations: Secondary | ICD-10-CM

## 2022-04-25 DIAGNOSIS — E782 Mixed hyperlipidemia: Secondary | ICD-10-CM

## 2022-04-25 DIAGNOSIS — R209 Unspecified disturbances of skin sensation: Secondary | ICD-10-CM

## 2022-04-25 DIAGNOSIS — R7303 Prediabetes: Secondary | ICD-10-CM

## 2022-04-25 NOTE — Progress Notes (Signed)
ID:  Lauren [name], Lauren [name] 30-Nov-1955, MRN [id number]  PCP:  Michael Boston, MD  Cardiologist:  Rex Kras, DO, [facility] (established care 12/31/2019)  Date: 04/25/22 Last Office Visit: 02/22/2021   Chief Complaint  Patient presents with   Palpitations   Follow-up    HPI  Lauren [name] is a 66 y.o. female whose past medical history and cardiovascular risk factors include: Prediabetes, mixed hyperlipidemia, obesity due to excess calories, postmenopausal female, advanced age.  With regards for palpitations and patient was started on diltiazem which completely resolved her symptoms; however, due to underlying essential tremors PCP had transitioned the patient from Cardizem to propranolol.  She now presents for 1 year follow-up visit.  Over the last 1 year patient doing well from a cardiovascular standpoint.  Denies anginal discomfort or heart failure symptoms.  Her palpitations are well controlled on current dose of propranolol.  Her lower extremity swelling is well controlled on current dose of Lasix.  At times she feels that her feet and ankles are cool bilaterally and is concerned that she may have PAD.  She denies claudication.  ALLERGIES: Allergies  Allergen Reactions   Codeine Itching   Sulfa Antibiotics Diarrhea and Nausea And Vomiting    MEDICATION LIST PRIOR TO VISIT: Current Meds  Medication Sig   alendronate (FOSAMAX) 70 MG tablet 70 mg once a week.   atorvastatin (LIPITOR) 10 MG tablet TAKE ONE TABLET BY MOUTH EVERY NIGHT AT BEDTIME   b complex vitamins capsule Take 1 capsule by mouth daily.   Cholecalciferol 25 MCG (1000 UT) capsule Take 1 capsule by mouth daily.   furosemide (LASIX) 20 MG tablet Take 1 tablet by mouth as needed.   gabapentin (NEURONTIN) 300 MG capsule Take 300 mg by mouth at bedtime.   meloxicam (MOBIC) 15 MG tablet Take 15 mg by mouth daily.   METFORMIN HCL ER PO Take 1,000 mg by mouth 2 (two) times daily.     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  SOCIAL HISTORY:  The patient  reports that she has never smoked. She has never used smokeless tobacco. She reports that she does not currently use alcohol. She reports that she does not use drugs.  REVIEW OF SYSTEMS: Review of Systems  Constitutional: Negative for chills, fever and malaise/fatigue.  HENT:  Negative for hoarse voice and nosebleeds.   Eyes:  Negative for discharge, double vision and pain.  Cardiovascular:  Positive for leg swelling (chronic and stable. ).  Negative for chest pain, claudication, dyspnea on exertion, near-syncope, orthopnea, palpitations, paroxysmal nocturnal dyspnea and syncope.       Distal to her ankles bilaterally at times she feels " cold sensation."  Respiratory:  Negative for hemoptysis and shortness of breath.   Musculoskeletal:  Negative for muscle cramps and myalgias.  Gastrointestinal:  Negative for abdominal pain, constipation, diarrhea, hematemesis, hematochezia, melena, nausea and vomiting.  Neurological:  Negative for dizziness and  light-headedness.    PHYSICAL EXAM:    04/25/2022   10:02 AM 10/05/2021    1:37 PM 09/04/2021    9:28 AM  Vitals with BMI  Height '5\' 5"'  '5\' 5"'  '5\' 5"'   Weight 198 lbs 200 lbs 199 lbs 10 oz  BMI 32.95 10.30 13.14  Systolic 388 875 797  Diastolic 70 55 86  Pulse 72 74 68   Physical Exam  Constitutional: No distress.  Age appropriate, hemodynamically stable.   Neck: No JVD present.  Cardiovascular: Normal rate, regular rhythm, S1 normal, S2 normal, intact distal pulses and normal pulses. PMI is displaced. Exam reveals no gallop, no S3 and no S4.  No murmur heard. Pulses:      Dorsalis pedis pulses are 2+ on the right side and 2+ on the left side.       Posterior tibial pulses are 2+ on the right side and 2+ on the left side.  Pulmonary/Chest: Effort normal and breath sounds normal. No stridor. She has no wheezes. She has no rales.  Abdominal: Soft. Bowel sounds are normal. She exhibits no distension. There is no abdominal tenderness.  Musculoskeletal:        General: No edema.     Cervical back: Neck supple.  Neurological: She is alert and oriented to person, place, and time. She has intact cranial nerves (2-12).  Skin: Skin is warm and moist.   CARDIAC DATABASE: EKG: 04/25/2022: Normal sinus rhythm, 72 bpm, normal axis, without underlying ischemia injury pattern.  Echocardiogram: 01/06/2020:  Normal LV systolic function with visual EF 55-60%. Left ventricle cavity is normal in size. Mild left ventricular hypertrophy. Normal global wall motion. Indeterminate diastolic filling pattern, normal LAP.  Left atrial cavity is mildly dilated.  Mild (Grade I) mitral regurgitation.  IVC is dilated with a respiratory response of <50%.  No prior study for comparison.   Stress Testing: Exercise Myoview stress test 01/24/2020:  Exercise nuclear stress test was performed using Bruce protocol. Patient reached 7 METS, and 96% of age predicted maximum heart rate. Exercise capacity was low. Chest  pain not reported. Heart rate and hemodynamic response were normal. Stress EKG revealed no ischemic changes.  Normal myocardial perfusion. Stress LVEF 67%. Low risk study,  Coronary artery calcification scoring performed on 01/05/2020 at [facility]:  Total calcium score: 0 AU  Incidental finding of a 29 mm left lobe liver cyst.    Heart Catheterization: None  LABORATORY DATA:  External Labs: Collected: 12/30/2019 Hemoglobin 14.6 g/dL Creatinine 0.73 mg/dL. eGFR: 86 mL/min per 1.73 m Potassium: 4.2 Lipid profile: Total cholesterol 276, triglycerides 180, HDL 74, LDL 167 TSH: 3.485  ProBNP: 18pg/mL  IMPRESSION:    ICD-10-CM   1. Palpitations  R00.2 EKG 12-Lead    2. Sensation of cold in lower extremity  R20.9 PCV ANKLE BRACHIAL INDEX (ABI)    3. Prediabetes  R73.03     4. Mixed hyperlipidemia  E78.2        RECOMMENDATIONS: Dorethy Tomey is a 66 y.o. female whose past medical history and  cardiac risk factors include: Prediabetes, mixed hyperlipidemia, obesity due to excess calories, postmenopausal female, advanced age.  Patient presents today for 1 year follow-up visit for palpitations.  She is doing well on propranolol 20 mg p.o. daily and has not had any reoccurrence of of symptoms.  Over the last 1 year she denies anginal discomfort or heart failure symptoms EKG today is nonischemic.  Since last 1 year she has also undergone knee surgery and cataracts and recovering well.  Outside labs from June 2023 independently reviewed as part of today's encounter.  At times she feels that distal to her ankles bilaterally she has a cool sensation more worse when exposed to cold weather's.  She has bilateral pulses which are 2+ and no open sores or wounds.  I suspect that she may have Raynaud's phenomena but she is concerned about PAD.  Given the fact that she is a pre-diabetic would like to screen her for PAD.  This shared decision was to proceed with ABI prior to the next  office visit.  Patient follows up with PCP in the next 2 or 3 weeks and will have labs forwarded to [place] for further reference.  FINAL MEDICATION LIST END OF ENCOUNTER: No orders of the defined types were placed in this encounter.    Current Outpatient Medications:    alendronate (FOSAMAX) 70 MG tablet, 70 mg once a week., Disp: , Rfl:    atorvastatin (LIPITOR) 10 MG tablet, TAKE ONE TABLET BY MOUTH EVERY NIGHT AT BEDTIME, Disp: 90 tablet, Rfl: 1   b complex vitamins capsule, Take 1 capsule by mouth daily., Disp: , Rfl:    Cholecalciferol 25 MCG (1000 UT) capsule, Take 1 capsule by mouth daily., Disp: , Rfl:    furosemide (LASIX) 20 MG tablet, Take 1 tablet by mouth as needed., Disp: , Rfl:    gabapentin (NEURONTIN) 300 MG capsule, Take 300 mg by mouth at bedtime., Disp: , Rfl:    meloxicam (MOBIC) 15 MG tablet, Take 15 mg by mouth daily., Disp: , Rfl:    METFORMIN HCL ER PO, Take 1,000 mg by mouth 2 (two) times daily., Disp: , Rfl:    propranolol (INDERAL) 20 MG tablet, Take 20 mg by mouth daily., Disp: , Rfl:   Orders Placed This Encounter  Procedures   EKG 12-Lead   PCV ANKLE BRACHIAL INDEX (ABI)    There are no Patient Instructions on file for this visit.  Months --Continue cardiac medications as reconciled in final medication list. --Return in about 1 year (around 04/26/2023) for Annual follow up visit palpitations. Or sooner if needed. --Continue follow-up with your primary care physician regarding the management of your other chronic comorbid conditions.  Patient's questions and concerns were addressed to her satisfaction. She voices understanding of the instructions provided during this encounter.   This note was created using a voice recognition software as a result there may be grammatical errors inadvertently enclosed that do not reflect the nature of this encounter. Every attempt is made to correct such errors.  Total time spent: 34 minutes.  Rex Kras, [place],  [facility]  Pager: [phone] Office: [phone]

## 2022-05-04 ENCOUNTER — Encounter: Payer: Self-pay | Admitting: Gastroenterology

## 2022-05-16 ENCOUNTER — Other Ambulatory Visit: Payer: Self-pay | Admitting: Cardiology

## 2022-05-16 ENCOUNTER — Encounter: Payer: Self-pay | Admitting: Gastroenterology

## 2022-05-16 NOTE — Progress Notes (Signed)
External Labs: Collected: 05/09/2022 provided by primary care physician. Sodium 140, potassium 4.6, chloride 102, bicarb 27,  BUN 16, creatinine 0.7. AST 16, ALT 16, alkaline phosphatase 70 Hemoglobin 13.7, hematocrit 36.6% Total cholesterol 161, triglycerides 100, HDL 54, LDL 87, non-HDL 107 TSH 3.13. A1c 5.6

## 2022-05-17 ENCOUNTER — Other Ambulatory Visit: Payer: Self-pay

## 2022-05-17 ENCOUNTER — Ambulatory Visit (AMBULATORY_SURGERY_CENTER): Payer: Self-pay | Admitting: *Deleted

## 2022-05-17 VITALS — Ht 65.0 in | Wt 197.0 lb

## 2022-05-17 DIAGNOSIS — Z86010 Personal history of colonic polyps: Secondary | ICD-10-CM

## 2022-05-17 MED ORDER — NA SULFATE-K SULFATE-MG SULF 17.5-3.13-1.6 GM/177ML PO SOLN: 1.0000 | Freq: Once | ORAL | 0 refills | Status: AC

## 2022-05-17 NOTE — Progress Notes (Signed)
Pre visit completed over telephone.  No egg or soy allergy known to patient  No issues known to pt with past sedation with any surgeries or procedures Patient denies ever being told they had issues or difficulty with intubation  No FH of Malignant Hyperthermia Pt is not on diet pills Pt is not on  home 02  Pt is not on blood thinners  Pt denies issues with constipation  No A fib or A flutter  Pt instructed to use Singlecare.com or GoodRx for a price reduction on prep

## 2022-05-20 ENCOUNTER — Other Ambulatory Visit: Payer: Self-pay | Admitting: Internal Medicine

## 2022-05-20 DIAGNOSIS — M85859 Other specified disorders of bone density and structure, unspecified thigh: Secondary | ICD-10-CM

## 2022-05-22 ENCOUNTER — Telehealth: Payer: Self-pay | Admitting: Gastroenterology

## 2022-05-22 DIAGNOSIS — Z86010 Personal history of colonic polyps: Secondary | ICD-10-CM

## 2022-05-22 MED ORDER — NA SULFATE-K SULFATE-MG SULF 17.5-3.13-1.6 GM/177ML PO SOLN: 1.0000 | Freq: Once | ORAL | 0 refills | Status: AC

## 2022-05-22 NOTE — Telephone Encounter (Signed)
Inbound call from patient states her prep rx is too expensive and she would like to speak with someone. Please advise.

## 2022-05-22 NOTE — Telephone Encounter (Signed)
Resent Suprep to [facility] in [place]

## 2022-06-26 ENCOUNTER — Encounter: Payer: Self-pay | Admitting: Gastroenterology

## 2022-06-26 ENCOUNTER — Ambulatory Visit (AMBULATORY_SURGERY_CENTER): Payer: Medicare Other | Admitting: Gastroenterology

## 2022-06-26 VITALS — BP 102/59 | HR 80 | Temp 97.5°F | Resp 14 | Ht 65.0 in | Wt 197.0 lb

## 2022-06-26 DIAGNOSIS — D128 Benign neoplasm of rectum: Secondary | ICD-10-CM

## 2022-06-26 DIAGNOSIS — K621 Rectal polyp: Secondary | ICD-10-CM

## 2022-06-26 DIAGNOSIS — Z09 Encounter for follow-up examination after completed treatment for conditions other than malignant neoplasm: Secondary | ICD-10-CM | POA: Diagnosis not present

## 2022-06-26 DIAGNOSIS — D120 Benign neoplasm of cecum: Secondary | ICD-10-CM | POA: Diagnosis not present

## 2022-06-26 DIAGNOSIS — D125 Benign neoplasm of sigmoid colon: Secondary | ICD-10-CM | POA: Diagnosis not present

## 2022-06-26 DIAGNOSIS — Z86010 Personal history of colonic polyps: Secondary | ICD-10-CM | POA: Diagnosis not present

## 2022-06-26 MED ORDER — SODIUM CHLORIDE 0.9 % IV SOLN: 500.0000 mL | Freq: Once | INTRAVENOUS | Status: DC

## 2022-06-26 MED ORDER — SODIUM CHLORIDE 0.9 % IV SOLN: 500.0000 mL | INTRAVENOUS | Status: DC

## 2022-06-26 NOTE — Progress Notes (Signed)
Called to room to assist during endoscopic procedure.  Patient ID and intended procedure confirmed with present staff. Received instructions for my participation in the procedure from the performing physician.  

## 2022-06-26 NOTE — Patient Instructions (Signed)
Handouts on polyps and diverticulosis given to you today  Await pathology results    YOU HAD AN ENDOSCOPIC PROCEDURE TODAY AT [facility]:   Refer to the procedure report that was given to you for any specific questions about what was found during the examination.  If the procedure report does not answer your questions, please call your gastroenterologist to clarify.  If you requested that your care partner not be given the details of your procedure findings, then the procedure report has been included in a sealed envelope for you to review at your convenience later.  YOU SHOULD EXPECT: Some feelings of bloating in the abdomen. Passage of more gas than usual.  Walking can help get rid of the air that was put into your GI tract during the procedure and reduce the bloating. If you had a lower endoscopy (such as a colonoscopy or flexible sigmoidoscopy) you may notice spotting of blood in your stool or on the toilet paper. If you underwent a bowel prep for your procedure, you may not have a normal bowel movement for a few days.  Please Note:  You might notice some irritation and congestion in your nose or some drainage.  This is from the oxygen used during your procedure.  There is no need for concern and it should clear up in a day or so.  SYMPTOMS TO REPORT IMMEDIATELY:  Following lower endoscopy (colonoscopy or flexible sigmoidoscopy):  Excessive amounts of blood in the stool  Significant tenderness or worsening of abdominal pains  Swelling of the abdomen that is new, acute  Fever of 100F or higher  For urgent or emergent issues, a gastroenterologist can be reached at any hour by calling [phone]. Do not use MyChart messaging for urgent concerns.    DIET:  We do recommend a small meal at first, but then you may proceed to your regular diet.  Drink plenty of fluids but you should avoid alcoholic beverages for 24 hours.  ACTIVITY:  You should plan to take it easy for the  rest of today and you should NOT DRIVE or use heavy machinery until tomorrow (because of the sedation medicines used during the test).    FOLLOW UP: Our staff will call the number listed on your records the next business day following your procedure.  We will call around 7:15- 8:00 am to check on you and address any questions or concerns that you may have regarding the information given to you following your procedure. If we do not reach you, we will leave a message.     If any biopsies were taken you will be contacted by phone or by letter within the next 1-3 weeks.  Please call us at [phone] if you have not heard about the biopsies in 3 weeks.    SIGNATURES/CONFIDENTIALITY: You and/or your care partner have signed paperwork which will be entered into your electronic medical record.  These signatures attest to the fact that that the information above on your After Visit Summary has been reviewed and is understood.  Full responsibility of the confidentiality of this discharge information lies with you and/or your care-partner.

## 2022-06-26 NOTE — Progress Notes (Signed)
[facility] History and Physical   Primary Care Physician:  Michael Boston, MD   Reason for Procedure:   Colon polyp surveillance, history of neuroendocrine tumor  Plan:    Colonoscopy     HPI: Lauren [name] is a 66 y.o. 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  Her last colonoscopy was in Nov 2018 at [facility] and a 9 mm tubular adenoma was removed in the cecum.  No evidence of carcinoid in the colon at that time.  She did not require systemic therapy for her disease.    Past Medical History:  Diagnosis Date   Allergy    Arthritis    Benign carcinoid tumor of small intestine 2007   Cataract 2023   Depression    Diabetes mellitus without complication ([place])    Diverticulosis of colon    Endometriosis    Essential tremor    GERD (gastroesophageal reflux disease)    Hx of adenomatous polyp of colon 05/2017   Hyperlipidemia    Hypertension    Neuroendocrine carcinoma of appendix ([place])    Obesity    Osteopenia    Osteoporosis    Prediabetes     Past Surgical History:  Procedure Laterality Date   APPENDECTOMY  11/2005   BUNIONECTOMY     CATARACT EXTRACTION Right 02/06/2022   CATARACT EXTRACTION Left 02/20/2022   OOPHORECTOMY     TOTAL ABDOMINAL HYSTERECTOMY  2007   TOTAL KNEE ARTHROPLASTY Left 11/13/2021    Prior to Admission medications   Medication Sig Start Date End Date Taking? Authorizing Provider  alendronate (FOSAMAX) 70 MG tablet 70 mg once a week. 04/20/21  Yes [provider]  atorvastatin (LIPITOR) 10 MG tablet TAKE ONE TABLET BY MOUTH EVERY NIGHT AT BEDTIME 01/24/22  Yes Tolia, Sunit, DO  b complex vitamins capsule Take 1 capsule by mouth daily.   Yes [provider]  furosemide (LASIX) 20 MG tablet Take 1 tablet by mouth as needed. 07/13/19  Yes [provider]  propranolol (INDERAL) 20 MG tablet Take 20 mg  by mouth daily. 01/27/21  Yes [provider]  Cholecalciferol 25 MCG (1000 UT) capsule Take 1 capsule by mouth daily.    [provider]  gabapentin (NEURONTIN) 300 MG capsule Take 300 mg by mouth at bedtime. 09/12/21   [provider]  meloxicam (MOBIC) 15 MG tablet Take 15 mg by mouth daily.    [provider]  METFORMIN HCL ER PO Take 1,000 mg by mouth 2 (two) times daily. 07/19/19 05/17/22  [provider]    Current Outpatient Medications  Medication Sig Dispense Refill   alendronate (FOSAMAX) 70 MG tablet 70 mg once a week.     atorvastatin (LIPITOR) 10 MG tablet TAKE ONE TABLET BY MOUTH EVERY NIGHT AT BEDTIME 90 tablet 1   b complex vitamins capsule Take 1 capsule by mouth daily.     furosemide (LASIX) 20 MG tablet Take 1 tablet by mouth as needed.     propranolol (INDERAL) 20 MG tablet Take 20 mg by mouth daily.     Cholecalciferol 25 MCG (1000 UT) capsule Take 1 capsule by mouth daily.     gabapentin (NEURONTIN) 300 MG capsule Take 300 mg by mouth at bedtime.     meloxicam (MOBIC) 15 MG tablet Take 15 mg by mouth daily.     METFORMIN HCL ER PO Take 1,000 mg  by mouth 2 (two) times daily.     Current Facility-Administered Medications  Medication Dose Route Frequency Provider Last Rate Last Admin   0.9 %  sodium chloride infusion  500 mL Intravenous Once Dustin Flock E, MD       0.9 %  sodium chloride infusion  500 mL Intravenous Continuous Daryel November, MD        Allergies as of 06/26/2022 - Review Complete 06/26/2022  Allergen Reaction Noted   Codeine Itching 10/31/2016   Sulfa antibiotics Diarrhea and Nausea And Vomiting 12/31/2019    Family History  Problem Relation Age of Onset   Breast cancer Mother    Lung cancer Mother    Alcoholism Mother    Arthritis Mother    Bone cancer Father    Brain cancer Father    Hypertension Father    Lung cancer Father     Social History   Socioeconomic History   Marital  status: Married    Spouse name: Not on file   Number of children: 0   Years of education: Not on file   Highest education level: Not on file  Occupational History   Occupation: Retired Therapist, sports  Tobacco Use   Smoking status: Never   Smokeless tobacco: Never  Vaping Use   Vaping Use: Never used  Substance and Sexual Activity   Alcohol use: Not Currently   Drug use: Never   Sexual activity: Not Currently  Other Topics Concern   Not on file  Social History Narrative   2 CHILDREN: Both adopted, patient stated that she was unable to have children.   Social Determinants of Health   Financial Resource Strain: Not on file  Food Insecurity: Not on file  Transportation Needs: Not on file  Physical Activity: Not on file  Stress: Not on file  Social Connections: Not on file  Intimate Partner Violence: Not on file    Review of Systems:  All other review of systems negative except as mentioned in the HPI.  Physical Exam: Vital signs BP 128/85   Pulse 93   Temp (!) 97.5 F (36.4 C)   Ht '5\' 5"'$  (1.651 m)   Wt 197 lb (89.4 kg)   SpO2 97%   BMI 32.78 kg/m   General:   Alert,  Well-developed, well-nourished, pleasant and cooperative in NAD Airway:  Mallampati 2 Lungs:  Clear throughout to auscultation.   Heart:  Regular rate and rhythm; no murmurs, clicks, rubs,  or gallops. Abdomen:  Soft, nontender and nondistended. Normal bowel sounds.   Neuro/Psych:  Normal mood and affect. A and O x 3   Epsie Walthall E. Candis Schatz, MD [facility]

## 2022-06-26 NOTE — Op Note (Addendum)
[facility] Patient Name: Lauren [name] Procedure Date: 06/26/2022 8:38 AM MRN: [id number] Endoscopist: Nicki Reaper E. Candis Schatz , MD, [id number] Age: 66 Referring MD:  Date of Birth: 29-Apr-1956 Gender: Female Account #: [id number] Procedure:                Colonoscopy Indications:              High risk colon cancer surveillance: Personal                            history of adenoma less than 10 mm in size, history                            of malignant neoendocrine tumor of the appendix Medicines:                Monitored Anesthesia Care Procedure:                Pre-Anesthesia Assessment:                           - Prior to the procedure, a History and Physical                            was performed, and patient medications and                            allergies were reviewed. The patient's tolerance of                            previous anesthesia was also reviewed. The risks                            and benefits of the procedure and the sedation                            options and risks were discussed with the patient.                            All questions were answered, and informed consent                            was obtained. Prior Anticoagulants: The patient has                            taken no anticoagulant or antiplatelet agents. ASA                            Grade Assessment: II - A patient with mild systemic                            disease. After reviewing the risks and benefits,                            the patient was deemed in satisfactory condition to  undergo the procedure.                           After obtaining informed consent, the colonoscope                            was passed under direct vision. Throughout the                            procedure, the patient's blood pressure, pulse, and                            oxygen saturations were monitored continuously. The                             Colonoscope was introduced through the anus and                            advanced to the the terminal ileum, with                            identification of the appendiceal orifice and IC                            valve. The colonoscopy was somewhat difficult due                            to multiple diverticula in the colon, restricted                            mobility of the colon and a tortuous colon.                            Successful completion of the procedure was aided by                            using manual pressure. The patient tolerated the                            procedure well. The quality of the bowel                            preparation was good. The terminal ileum, ileocecal                            valve, appendiceal orifice, and rectum were                            photographed. The bowel preparation used was SUPREP                            via split dose instruction. Scope In: 8:51:00 AM Scope Out: 9:25:24 AM Scope Withdrawal Time: 0 hours 16 minutes 24 seconds  Total Procedure Duration: 0 hours  34 minutes 24 seconds  Findings:                 The perianal and digital rectal examinations were                            normal. Pertinent negatives include normal                            sphincter tone and no palpable rectal lesions.                           A 7 mm polyp was found in the cecum. The polyp was                            sessile. The polyp was removed with a cold snare.                            Resection and retrieval were complete. Estimated                            blood loss was minimal. The pathology specimen was                            placed into Bottle Number 1.                           Two sessile polyps were found in the sigmoid colon.                            The polyps were 3 to 4 mm in size. These polyps                            were removed with a cold snare. Resection and                             retrieval were complete. The pathology specimen was                            placed into Bottle Number 1. Estimated blood loss                            was minimal.                           A 4 mm polyp was found in the distal rectum. The                            polyp was sessile. The polyp was removed with a                            cold snare. Resection and retrieval were complete.  The pathology specimen was placed into Bottle                            Number 2. Estimated blood loss was minimal.                           Many large-mouthed and small-mouthed diverticula                            were found in the sigmoid colon, descending colon,                            transverse colon and ascending colon. There was no                            evidence of diverticular bleeding.                           The exam was otherwise normal throughout the                            examined colon.                           The terminal ileum appeared normal.                           The retroflexed view of the distal rectum and anal                            verge was normal and showed no anal or rectal                            abnormalities. Complications:            No immediate complications. Estimated Blood Loss:     Estimated blood loss was minimal. Impression:               - One 7 mm polyp in the cecum, removed with a cold                            snare. Resected and retrieved.                           - Two 3 to 4 mm polyps in the sigmoid colon,                            removed with a cold snare. Resected and retrieved.                           - One 4 mm polyp in the distal rectum, removed with                            a cold snare. Resected and retrieved.                           -  Severe diverticulosis in the sigmoid colon, in                            the descending colon, in the transverse colon and                             in the ascending colon. There was no evidence of                            diverticular bleeding.                           - The examined portion of the ileum was normal.                           - The distal rectum and anal verge are normal on                            retroflexion view. Recommendation:           - Patient has a contact number available for                            emergencies. The signs and symptoms of potential                            delayed complications were discussed with the                            patient. Return to normal activities tomorrow.                            Written discharge instructions were provided to the                            patient.                           - Resume previous diet.                           - Continue present medications.                           - Await pathology results.                           - Repeat colonoscopy (date not yet determined) for                            surveillance based on pathology results.                           - Recommend high fiber diet or fiber  supplementation to reduce risk of diverticular                            complications. Remonia Otte E. Candis Schatz, MD 06/26/2022 9:32:45 AM This report has been signed electronically.

## 2022-06-26 NOTE — Progress Notes (Signed)
Pt's states no medical or surgical changes since previsit or office visit. 

## 2022-06-26 NOTE — Progress Notes (Signed)
Sedate, gd SR, tolerated procedure well, VSS, report to RN 

## 2022-06-27 ENCOUNTER — Telehealth: Payer: Self-pay

## 2022-06-27 NOTE — Telephone Encounter (Signed)
  Follow up Call-     06/26/2022    7:56 AM  Call back number  Post procedure Call Back phone  # [phone]  Permission to leave phone message Yes     Patient questions:  Do you have a fever, pain , or abdominal swelling? No. Pain Score  0 *  Have you tolerated food without any problems? Yes.    Have you been able to return to your normal activities? Yes.    Do you have any questions about your discharge instructions: Diet   No. Medications  No. Follow up visit  No.  Do you have questions or concerns about your Care? No.  Actions: * If pain score is 4 or above: No action needed, pain <4.

## 2022-07-03 NOTE — Progress Notes (Signed)
Lauren [name],  Two of the four polyps which I removed during your recent procedure were proven to be completely benign but are considered "pre-cancerous" polyps that MAY have grown into cancer if they had not been removed.  The other two polyps were hyperplastic polyps, which are not considered precancerous.  Studies shows that at least 20% of women over age 68 and 30% of men over age 62 have pre-cancerous polyps.  Based on current nationally recognized surveillance guidelines, I recommend that you have a repeat colonoscopy in 7 years.   If you develop any new rectal bleeding, abdominal pain or significant bowel habit changes, please contact me before then.

## 2022-07-19 ENCOUNTER — Other Ambulatory Visit: Payer: Self-pay | Admitting: Cardiology

## 2022-07-19 DIAGNOSIS — E7800 Pure hypercholesterolemia, unspecified: Secondary | ICD-10-CM

## 2022-10-04 ENCOUNTER — Inpatient Hospital Stay (HOSPITAL_BASED_OUTPATIENT_CLINIC_OR_DEPARTMENT_OTHER): Payer: Medicare Other | Admitting: Oncology

## 2022-10-04 ENCOUNTER — Inpatient Hospital Stay: Payer: Medicare Other | Attending: Oncology

## 2022-10-04 VITALS — BP 132/58 | HR 82 | Temp 98.1°F | Resp 18 | Ht 65.0 in | Wt 196.0 lb

## 2022-10-04 DIAGNOSIS — M8580 Other specified disorders of bone density and structure, unspecified site: Secondary | ICD-10-CM | POA: Diagnosis not present

## 2022-10-04 DIAGNOSIS — Z85030 Personal history of malignant carcinoid tumor of large intestine: Secondary | ICD-10-CM | POA: Insufficient documentation

## 2022-10-04 DIAGNOSIS — I10 Essential (primary) hypertension: Secondary | ICD-10-CM | POA: Insufficient documentation

## 2022-10-04 DIAGNOSIS — C7A098 Malignant carcinoid tumors of other sites: Secondary | ICD-10-CM | POA: Diagnosis not present

## 2022-10-04 DIAGNOSIS — E119 Type 2 diabetes mellitus without complications: Secondary | ICD-10-CM | POA: Diagnosis not present

## 2022-10-04 NOTE — Progress Notes (Signed)
  [facility] OFFICE PROGRESS NOTE   Diagnosis: Carcinoid tumor  INTERVAL HISTORY:   Lauren [name] returns as scheduled.  She reports intermittent flushing since last summer.  She has diarrhea a few days per week for the past few months.  She has intermittent discomfort at the right lateral subcostal region.  No consistent pain.  No fever.  Objective:  Vital signs in last 24 hours:  Blood pressure (!) 132/58, pulse 82, temperature 98.1 F (36.7 C), temperature source Oral, resp. rate 18, height 5\' 5"  (1.651 m), weight 196 lb (88.9 kg), SpO2 98 %.    Lymphatics: No cervical, supraclavicular, axillary, or inguinal nodes Resp: Lungs clear bilaterally Cardio: Regular rate and rhythm GI: No hepatosplenomegaly, no mass, nontender Vascular: No leg edema  Lab Results:  No results found for: "WBC", "HGB", "HCT", "MCV", "PLT", "NEUTROABS"  CMP  Lab Results  Component Value Date   NA 143 10/26/2020   K 4.8 10/26/2020   CL 104 10/26/2020   CO2 23 10/26/2020   GLUCOSE 118 (H) 10/26/2020   BUN 10 10/26/2020   CREATININE 0.77 10/26/2020   CALCIUM 9.2 10/26/2020   PROT 6.0 10/26/2020   ALBUMIN 4.3 10/26/2020   AST 13 10/26/2020   ALT 16 10/26/2020   ALKPHOS 86 10/26/2020   BILITOT 0.2 10/26/2020   GFRNONAA 67 09/08/2020   GFRAA 77 09/08/2020    Medications: I have reviewed the patient's current medications.   Assessment/Plan: Carcinoid tumor of the appendix TAH/BSO/debulking of endometriosis, partial colpectomy, and appendectomy 12/05/2005-multiple small bowel lesions present, 2 mm well-differentiated neuroendocrine tumor at the tip of the appendix within the submucosa, small bowel excisional biopsy with a small focus of neuroendocrine tumor surrounded by plasmacytic infiltrate with associated microcalcifications 01/08/2006-24-hour urine 5 HIAA-normal, mildly elevated chromogranin A at 285, negative octreotide scan, followed with observation Periodic CT enterography  negative-most recent 07/31/2009 11 /20/2018-dotatate PET at [facility]-one 0.9 cm retroperitoneal nodule with no other site of active disease 06/12/2017-colonoscopy negative for carcinoid involvement 09/24/2018-dotatate PET-no active disease, previously reported 0.9 cm red.  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  I doubt her intermittent flushing and diarrhea are related to carcinoid syndrome.  She will contact us if the symptoms become consistent.  We will follow-up on the chromogranin A level from today.  The plan is to schedule a repeat dotatate PET if she develops consistent symptoms or a significant rise in the chromogranin a level.  She will return for an office visit in 1 year.  She will call in the interim as needed.  Betsy Coder, MD  10/04/2022  11:06 AM

## 2022-10-07 LAB — CHROMOGRANIN A: Chromogranin A (ng/mL): 126.3 ng/mL — ABNORMAL HIGH (ref 0.0–101.8)

## 2022-10-08 ENCOUNTER — Telehealth: Payer: Self-pay | Admitting: *Deleted

## 2022-10-08 DIAGNOSIS — C7A098 Malignant carcinoid tumors of other sites: Secondary | ICD-10-CM

## 2022-10-08 NOTE — Telephone Encounter (Signed)
Left VM that results of chromogranin are in MyChart and MD recommends repeat lab in 4-6 months. Scheduler will be calling. Also sent information in MyChart.

## 2022-10-08 NOTE — Telephone Encounter (Signed)
-----   Message from Ladell Pier, MD sent at 10/08/2022  6:42 AM EDT ----- Please call patient, the chromogranin A is mildly elevated compared to last year, repeat 4-6 months, will consider PET-dotatatate if it continues to rise

## 2022-10-22 ENCOUNTER — Other Ambulatory Visit: Payer: Self-pay | Admitting: *Deleted

## 2022-10-22 DIAGNOSIS — M4316 Spondylolisthesis, lumbar region: Secondary | ICD-10-CM

## 2022-10-22 DIAGNOSIS — M47816 Spondylosis without myelopathy or radiculopathy, lumbar region: Secondary | ICD-10-CM

## 2022-10-22 DIAGNOSIS — M5451 Vertebrogenic low back pain: Secondary | ICD-10-CM

## 2022-10-31 ENCOUNTER — Inpatient Hospital Stay: Admission: RE | Admit: 2022-10-31 | Payer: Medicare Other | Source: Ambulatory Visit

## 2022-11-10 ENCOUNTER — Ambulatory Visit
Admission: RE | Admit: 2022-11-10 | Discharge: 2022-11-10 | Disposition: A | Discharge status: Home / Self Care | Payer: Medicare Other | Source: Ambulatory Visit | Attending: *Deleted | Admitting: *Deleted

## 2022-11-10 DIAGNOSIS — M4316 Spondylolisthesis, lumbar region: Secondary | ICD-10-CM

## 2022-11-10 DIAGNOSIS — M47816 Spondylosis without myelopathy or radiculopathy, lumbar region: Secondary | ICD-10-CM

## 2022-11-10 DIAGNOSIS — M5451 Vertebrogenic low back pain: Secondary | ICD-10-CM

## 2023-01-15 ENCOUNTER — Other Ambulatory Visit: Payer: Self-pay | Admitting: Cardiology

## 2023-01-15 DIAGNOSIS — E7800 Pure hypercholesterolemia, unspecified: Secondary | ICD-10-CM

## 2023-02-04 ENCOUNTER — Encounter: Payer: Self-pay | Admitting: Oncology

## 2023-02-05 ENCOUNTER — Encounter: Payer: Self-pay | Admitting: *Deleted

## 2023-02-05 ENCOUNTER — Inpatient Hospital Stay: Payer: Medicare Other | Attending: Oncology

## 2023-02-05 DIAGNOSIS — C7A098 Malignant carcinoid tumors of other sites: Secondary | ICD-10-CM

## 2023-02-05 DIAGNOSIS — C7A020 Malignant carcinoid tumor of the appendix: Secondary | ICD-10-CM | POA: Insufficient documentation

## 2023-02-06 LAB — CHROMOGRANIN A: Chromogranin A (ng/mL): 98.2 ng/mL (ref 0.0–101.8)

## 2023-02-07 ENCOUNTER — Other Ambulatory Visit: Payer: Self-pay | Admitting: Internal Medicine

## 2023-02-07 ENCOUNTER — Telehealth: Payer: Self-pay | Admitting: *Deleted

## 2023-02-07 DIAGNOSIS — Z1231 Encounter for screening mammogram for malignant neoplasm of breast: Secondary | ICD-10-CM

## 2023-02-07 NOTE — Telephone Encounter (Signed)
Notified of normal chromogranin and f/u as scheduled.

## 2023-02-07 NOTE — Telephone Encounter (Signed)
-----   Message from Thornton Papas sent at 02/06/2023  7:31 PM EDT ----- Please call patient chromogranin A is normal, follow-up as scheduled

## 2023-03-28 ENCOUNTER — Other Ambulatory Visit: Payer: Medicare Other

## 2023-04-07 ENCOUNTER — Other Ambulatory Visit: Payer: Medicare Other

## 2023-04-14 ENCOUNTER — Ambulatory Visit (HOSPITAL_COMMUNITY)
Admission: RE | Admit: 2023-04-14 | Discharge: 2023-04-14 | Disposition: A | Discharge status: Home / Self Care | Payer: Medicare Other | Source: Ambulatory Visit | Attending: Internal Medicine | Admitting: Internal Medicine

## 2023-04-14 DIAGNOSIS — R209 Unspecified disturbances of skin sensation: Secondary | ICD-10-CM

## 2023-04-14 LAB — VAS US ABI WITH/WO TBI
Left ABI: 1.21
Right ABI: 1.17

## 2023-04-29 ENCOUNTER — Ambulatory Visit: Payer: Medicare Other | Attending: Cardiology | Admitting: Cardiology

## 2023-04-29 ENCOUNTER — Encounter: Payer: Self-pay | Admitting: Cardiology

## 2023-04-29 VITALS — BP 104/60 | HR 74 | Resp 16 | Ht 65.0 in | Wt 201.0 lb

## 2023-04-29 DIAGNOSIS — E66811 Obesity, class 1: Secondary | ICD-10-CM | POA: Diagnosis not present

## 2023-04-29 DIAGNOSIS — Z6833 Body mass index (BMI) 33.0-33.9, adult: Secondary | ICD-10-CM | POA: Insufficient documentation

## 2023-04-29 DIAGNOSIS — E6609 Other obesity due to excess calories: Secondary | ICD-10-CM | POA: Diagnosis present

## 2023-04-29 DIAGNOSIS — R7303 Prediabetes: Secondary | ICD-10-CM | POA: Insufficient documentation

## 2023-04-29 DIAGNOSIS — E782 Mixed hyperlipidemia: Secondary | ICD-10-CM | POA: Diagnosis not present

## 2023-04-29 DIAGNOSIS — R002 Palpitations: Secondary | ICD-10-CM | POA: Diagnosis not present

## 2023-04-29 NOTE — Progress Notes (Signed)
Cardiology Office Note:  .   Date:  04/29/2023  ID:  [name], Lauren [birth year], MRN [id number] PCP:  Melida Quitter, MD  Former Cardiology Providers: NA [facility] Providers Cardiologist:  Tessa Lerner, DO , [facility] (established care 12/31/2019) Electrophysiologist:  None  Click to update primary MD,subspecialty MD or APP then REFRESH:1}    Chief Complaint  Patient presents with   Palpitations   Follow-up    History of Present Illness: Lauren [name]   Lauren [name] is a 67 y.o. Caucasian female whose past medical history and cardiovascular risk factors includes: Prediabetes, mixed hyperlipidemia, obesity due to excess calories, postmenopausal female, advanced age.   Patient has been followed in the practice for evaluation and management of palpitations.  In the past she was started on Cardizem and she did well.  However due to essential tremors PCP transitioned the patient back to propranolol.  She presents today for 1 year follow-up visit.  Over the last 1 year patient states that the palpitations are relatively stable on current pharmacological therapy.  Patient is concerned that she is having difficulty losing weight she is implementing multiple lifestyle changes to help facilitate weight loss but notes a significant fluctuation irrespective of her efforts.  She denies anginal chest pain or heart failure symptoms.  Review of Systems: .   Review of Systems  Cardiovascular:  Positive for palpitations (stable). Negative for chest pain, claudication, dyspnea on exertion, irregular heartbeat, leg swelling, near-syncope, orthopnea, paroxysmal nocturnal dyspnea and syncope.  Respiratory:  Negative for shortness of breath.   Hematologic/Lymphatic: Negative for bleeding problem.  Musculoskeletal:  Negative for muscle cramps and myalgias.  Neurological:  Negative for dizziness and light-headedness.    Studies Reviewed:   EKG: EKG Interpretation Date/Time:  Tuesday April 29 2023 11:06:00 EDT Ventricular Rate:  72 PR Interval:  142 QRS Duration:  96 QT Interval:  390 QTC Calculation: 427 R Axis:   63  Text Interpretation: Normal sinus rhythm Normal ECG No previous ECGs available Confirmed by Tessa Lerner [phone]) on 04/29/2023 11:07:37 AM  Echocardiogram: 01/06/2020:  Normal LV systolic function with visual EF 55-60%. Left ventricle cavity is normal in size. Mild left ventricular hypertrophy. Normal global wall motion. Indeterminate diastolic filling pattern, normal LAP.  Left atrial cavity is mildly dilated.  Mild (Grade I) mitral regurgitation.  IVC is dilated with a respiratory response of <50%.  No prior study for comparison.    Stress Testing: Exercise Myoview stress test 01/24/2020:  Exercise nuclear stress test was performed using Bruce protocol. Patient reached 7 METS, and 96% of age predicted maximum heart rate. Exercise capacity was low. Chest pain not reported. Heart rate and hemodynamic response were normal. Stress EKG revealed no ischemic changes.  Normal myocardial perfusion. Stress LVEF 67%. Low risk study,   Coronary artery calcification scoring performed on 01/05/2020 at [facility]:  Total calcium score: 0 AU  Incidental finding of a 29 mm left lobe liver cyst.    Lower extremity ABI September 2024: Right: Resting right ankle-brachial index is within normal range. The right toe-brachial index is normal.   Left: Resting left ankle-brachial index is within normal range. The left toe-brachial index is normal.   RADIOLOGY: NA  Risk Assessment/Calculations:   NA   Labs:        No data to display             Latest Ref Rng & Units 10/26/2020    9:02 AM 09/08/2020  9:20 AM  BMP  Glucose 65 - 99 mg/dL 409  811   BUN 8 - 27 mg/dL 10  13   Creatinine 9.14 - 1.00 mg/dL 7.82  9.56   BUN/Creat Ratio 12 - 28 13  14    Sodium 134 - 144 mmol/L 143  142   Potassium 3.5 - 5.2 mmol/L 4.8  4.8   Chloride 96 - 106 mmol/L 104  103    CO2 20 - 29 mmol/L 23    Calcium 8.7 - 10.3 mg/dL 9.2  9.7       Latest Ref Rng & Units 10/26/2020    9:02 AM 09/08/2020    9:20 AM  CMP  Glucose 65 - 99 mg/dL 213  086   BUN 8 - 27 mg/dL 10  13   Creatinine 5.78 - 1.00 mg/dL 4.69  6.29   Sodium 528 - 144 mmol/L 143  142   Potassium 3.5 - 5.2 mmol/L 4.8  4.8   Chloride 96 - 106 mmol/L 104  103   CO2 20 - 29 mmol/L 23    Calcium 8.7 - 10.3 mg/dL 9.2  9.7   Total Protein 6.0 - 8.5 g/dL 6.0  6.7   Total Bilirubin 0.0 - 1.2 mg/dL 0.2  0.3   Alkaline Phos 44 - 121 IU/L 86  97   AST 0 - 40 IU/L 13  17   ALT 0 - 32 IU/L 16      Lab Results  Component Value Date   CHOL 160 10/26/2020   HDL 62 10/26/2020   LDLCALC 80 10/26/2020   LDLDIRECT 71 10/26/2020   TRIG 100 10/26/2020   No results for input(s): "LIPOA" in the last 8760 hours. No components found for: "NTPROBNP" No results for input(s): "PROBNP" in the last 8760 hours. No results for input(s): "TSH" in the last 8760 hours.  External Labs: Collected: 05/09/2022 provided by primary care physician. Sodium 140, potassium 4.6, chloride 102, bicarb 27,  BUN 16, creatinine 0.7. AST 16, ALT 16, alkaline phosphatase 70 Hemoglobin 13.7, hematocrit 36.6% Total cholesterol 161, triglycerides 100, HDL 54, LDL 87, non-HDL 107 TSH 3.13. A1c 5.6  Physical Exam:    Today's Vitals   04/29/23 1019  BP: 104/60  Pulse: 74  Resp: 16  SpO2: 96%  Weight: 201 lb (91.2 kg)  Height: 5\' 5"  (1.651 m)   Body mass index is 33.45 kg/m. Wt Readings from Last 3 Encounters:  04/29/23 201 lb (91.2 kg)  10/04/22 196 lb (88.9 kg)  06/26/22 197 lb (89.4 kg)    Physical Exam  Constitutional: No distress.  Age appropriate, hemodynamically stable.   Neck: No JVD present.  Cardiovascular: Normal rate, regular rhythm, S1 normal, S2 normal, intact distal pulses and normal pulses. PMI is displaced. Exam reveals no gallop, no S3 and no S4.  No murmur heard. Pulses:      Dorsalis pedis pulses are  2+ on the right side and 2+ on the left side.       Posterior tibial pulses are 2+ on the right side and 2+ on the left side.  Pulmonary/Chest: Effort normal and breath sounds normal. No stridor. She has no wheezes. She has no rales.  Abdominal: Soft. Bowel sounds are normal. She exhibits no distension. There is no abdominal tenderness.  Musculoskeletal:        General: No edema.     Cervical back: Neck supple.  Neurological: She is alert and oriented to person, place, and time. She has  intact cranial nerves (2-12).  Skin: Skin is warm and moist.   Impression & Recommendation(s):  Impression:   ICD-10-CM   1. Palpitations  R00.2 EKG 12-Lead    2. Prediabetes  R73.03     3. Mixed hyperlipidemia  E78.2     4. Class 1 obesity due to excess calories without serious comorbidity with body mass index (BMI) of 33.0 to 33.9 in adult  E66.811    E66.09    Z68.33        Recommendation(s):  Palpitations Relatively stable. Was on calcium channel blockers later transitioned back to propranolol due to her underlying essential tremors. EKG shows a sinus rhythm without ectopy. No additional workup warranted at this time  Prediabetes Reemphasized importance of glycemic control. I have asked her to discuss either seeing weight loss clinic and/or dietitian as allowed by her insurance plan.  She will discuss this further with her PCP  Mixed hyperlipidemia Currently on atorvastatin 10 mg p.o. nightly.   She denies myalgia or other side effects. Most recent lipids dated October 2023, independently reviewed as noted above. She has an upcoming appointment with PCP on May 21, 2023 and will have new fasting lipids.  She will send [place] a copy for reference  Class 1 obesity due to excess calories without serious comorbidity with body mass index (BMI) of 33.0 to 33.9 in adult Body mass index is 33.45 kg/m. I reviewed with her importance of diet, regular physical activity/exercise, weight loss.    Patient is educated on the importance of increasing physical activity gradually as tolerated with a goal of moderate intensity exercise for 30 minutes a day 5 days a week.  Orders Placed:  Orders Placed This Encounter  Procedures   EKG 12-Lead    Final Medication List:   No orders of the defined types were placed in this encounter.   Medications Discontinued During This Encounter  Medication Reason   alendronate (FOSAMAX) 70 MG tablet Patient Preference     Current Outpatient Medications:    amitriptyline (ELAVIL) 25 MG tablet, Take 25 mg by mouth at bedtime., Disp: , Rfl:    atorvastatin (LIPITOR) 10 MG tablet, TAKE 1 TABLET BY MOUTH EVERY NIGHT AT BEDTIME, Disp: 90 tablet, Rfl: 1   b complex vitamins capsule, Take 1 capsule by mouth daily., Disp: , Rfl:    Cholecalciferol (VITAMIN D3) 50 MCG (2000 UT) CAPS, Take 2,000 Units by mouth daily., Disp: , Rfl:    furosemide (LASIX) 20 MG tablet, Take 1 tablet by mouth as needed., Disp: , Rfl:    meloxicam (MOBIC) 15 MG tablet, Take 15 mg by mouth daily., Disp: , Rfl:    METFORMIN HCL ER PO, Take 1,000 mg by mouth 2 (two) times daily., Disp: , Rfl:    propranolol (INDERAL) 20 MG tablet, Take 20 mg by mouth daily., Disp: , Rfl:   Consent:   NA  Disposition:   Follow-up in 1 year or sooner if needed  Her questions and concerns were addressed to her satisfaction. She voices understanding of the recommendations provided during this encounter.    Signed, Tessa Lerner, DO, [facility]  [facility]  [address] [place], [place] [address] 04/29/2023 11:31 PM

## 2023-04-29 NOTE — Patient Instructions (Signed)
Medication Instructions:  Your physician recommends that you continue on your current medications as directed. Please refer to the Current Medication list given to you today.  *If you need a refill on your cardiac medications before your next appointment, please call your pharmacy*  Lab Work: None ordered If you have labs (blood work) drawn today and your tests are completely normal, you will receive your results only by: MyChart Message (if you have MyChart) OR A paper copy in the mail If you have any lab test that is abnormal or we need to change your treatment, we will call you to review the results.  Testing/Procedures: None ordered  Follow-Up: At [facility], you and your health needs are our priority.  As part of our continuing mission to provide you with exceptional heart care, we have created designated Provider Care Teams.  These Care Teams include your primary Cardiologist (physician) and Advanced Practice Providers (APPs -  Physician Assistants and Nurse Practitioners) who all work together to provide you with the care you need, when you need it.  We recommend signing up for the patient portal called "MyChart".  Sign up information is provided on this After Visit Summary.  MyChart is used to connect with patients for Virtual Visits (Telemedicine).  Patients are able to view lab/test results, encounter notes, upcoming appointments, etc.  Non-urgent messages can be sent to your provider as well.   To learn more about what you can do with MyChart, go to [email or web address].    Your next appointment:   1 year(s)  The format for your next appointment:   In Person  Provider:   Tessa Lerner, DO {

## 2023-05-08 ENCOUNTER — Ambulatory Visit
Admission: RE | Admit: 2023-05-08 | Discharge: 2023-05-08 | Disposition: A | Discharge status: Home / Self Care | Payer: Medicare Other | Source: Ambulatory Visit | Attending: Internal Medicine | Admitting: Internal Medicine

## 2023-05-08 DIAGNOSIS — Z1231 Encounter for screening mammogram for malignant neoplasm of breast: Secondary | ICD-10-CM

## 2023-05-08 DIAGNOSIS — M85859 Other specified disorders of bone density and structure, unspecified thigh: Secondary | ICD-10-CM

## 2023-06-18 NOTE — Progress Notes (Signed)
67 y.o. No obstetric history on file. Married Caucasian female here for a NEW GYN// breast and pelvic exam.    Patient has had DES exposure.  Taking Amytriptyline since September for back pain. [facility].  She is noting some improvement in her emotions.  She has support of a friend.   Is experiencing Holiday blues.  Lost her brother a couple of years ago.  Did receive grief counseling.   She struggles with her weight.  Has a nutritionist in [place].   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  2 adopted children.  Has a son and an estranged daughter.   PCP: Melida Quitter, MD   No LMP recorded. Patient has had a hysterectomy.           Sexually active: No.  The current method of family planning is hysterectomy.    Menopausal hormone therapy:  n/a Exercising: Yes.     Walking Smoker:  no  OB History   No obstetric history on file.     HEALTH MAINTENANCE: Last 2 paps: before 2007 History of abnormal Pap or positive HPV:  no Mammogram:  05/08/23 Breast Density Cat A, BI-RADS CAT 1 neg Colonoscopy:  06/26/22 Bone Density:  05/08/23  Result  osteopenic   Immunization History  Administered Date(s) Administered   Influenza,inj,Quad PF,6+ Mos 05/16/2015, 05/04/2018   Influenza-Unspecified 05/09/2017, 04/13/2019, 03/15/2022   PFIZER Comirnaty(Gray Top)Covid-19 Tri-Sucrose Vaccine 11/18/2020, 03/27/2021   PFIZER(Purple Top)SARS-COV-2 Vaccination 08/05/2019, 08/26/2019   Pfizer Covid-19 Vaccine Bivalent Booster 54yrs & up 03/15/2022   Pneumococcal Conjugate-13 05/30/2019   Pneumococcal Polysaccharide-23 05/30/2019   Respiratory Syncytial Virus Vaccine,Recomb Aduvanted(Arexvy) 03/15/2022   Tdap 11/26/2010   Zoster Recombinant(Shingrix) 03/15/2022      reports that she has never smoked. She has never used smokeless tobacco. She reports that she does not currently use alcohol. She reports that she does not use drugs.  Past  Medical History:  Diagnosis Date   Allergy    Arthritis    Benign carcinoid tumor of small intestine 2007   Cataract 2023   Depression    DES exposure in utero    Diabetes mellitus without complication (HCC)    Diverticulosis of colon    Endometriosis    Essential tremor    GERD (gastroesophageal reflux disease)    Hx of adenomatous polyp of colon 05/2017   Hyperlipidemia    Hypertension    Neuroendocrine carcinoma of appendix (HCC)    Obesity    Osteopenia    Osteoporosis    Prediabetes     Past Surgical History:  Procedure Laterality Date   APPENDECTOMY  11/2005   BUNIONECTOMY     CATARACT EXTRACTION Right 02/06/2022   CATARACT EXTRACTION Left 02/20/2022   OOPHORECTOMY     TOTAL ABDOMINAL HYSTERECTOMY  2007   TOTAL KNEE ARTHROPLASTY Left 11/13/2021    Current Outpatient Medications  Medication Sig Dispense Refill   alendronate (FOSAMAX) 70 MG tablet 1 tablet 30 minutes before the first food, beverage or medicine of the day with plain water Orally once a week     amitriptyline (ELAVIL) 25 MG tablet Take 25 mg by mouth at bedtime.     atorvastatin (LIPITOR) 10 MG tablet TAKE 1 TABLET BY MOUTH EVERY NIGHT AT BEDTIME 90 tablet 1   b complex vitamins capsule Take 1 capsule by mouth daily.     Cholecalciferol (VITAMIN D3) 50 MCG (2000 UT) CAPS Take 2,000 Units  by mouth daily.     furosemide (LASIX) 20 MG tablet Take 1 tablet by mouth as needed.     Magnesium Oxide -Mg Supplement 500 MG TABS Take 2 tablets by mouth nightly Oral     meloxicam (MOBIC) 15 MG tablet Take 15 mg by mouth daily.     propranolol (INDERAL) 20 MG tablet Take 20 mg by mouth daily.     METFORMIN HCL ER PO Take 1,000 mg by mouth 2 (two) times daily.     No current facility-administered medications for this visit.    ALLERGIES: Codeine and Sulfa antibiotics  Family History  Problem Relation Age of Onset   Breast cancer Mother    Lung cancer Mother    Alcoholism Mother    Arthritis Mother     Bone cancer Father    Brain cancer Father    Hypertension Father    Lung cancer Father     Review of Systems  All other systems reviewed and are negative.   PHYSICAL EXAM:  BP 128/84 (BP Location: Left Arm, Patient Position: Sitting, Cuff Size: Large)   Pulse 85   Ht 5' 5.5" (1.664 m)   Wt 205 lb (93 kg)   SpO2 97%   BMI 33.59 kg/m     General appearance: alert, cooperative and appears stated age Head: normocephalic, without obvious abnormality, atraumatic Neck: no adenopathy, supple, symmetrical, trachea midline and thyroid normal to inspection and palpation Lungs: clear to auscultation bilaterally Breasts: normal appearance, no masses or tenderness, No nipple retraction or dimpling, No nipple discharge or bleeding, No axillary adenopathy Heart: regular rate and rhythm Abdomen: soft, non-tender; no masses, no organomegaly Extremities: extremities normal, atraumatic, no cyanosis or edema Skin: skin color, texture, turgor normal. No rashes or lesions Lymph nodes: cervical, supraclavicular, and axillary nodes normal. Neurologic: grossly normal  Pelvic: External genitalia:  no lesions              No abnormal inguinal nodes palpated.              Urethra:  normal appearing urethra with no masses, tenderness or lesions              Bartholins and Skenes: normal                 Vagina: normal appearing vagina with normal color and discharge, no lesions.  Atrophy noted.               Cervix: absent              Pap taken: Yes.   Bimanual Exam:  Uterus:  absent              Adnexa: no mass, fullness, tenderness              Rectal exam: Yes.  .  Confirms.              Anus:  normal sphincter tone, no lesions  Chaperone was present for exam:  Warren Lacy, CMA  ASSESSMENT: Encounter for breast and pelvic exam.  High risk Medicare patient.  Status post TAH/BSO for endometriosis.   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  Had concurrent dx of neuroendocrine tumors  on her small intestine.  Hx DES exposure. Vaginal cancer screening. Neuroendocrine tumor of the appendix.  Hx osteoporosis.  Current osteopenia of bilateral hips.  On Fosamax.  FH breast cancer in mother.  Weight gain.   PLAN: Mammogram screening discussed. Self  breast awareness reviewed. Pap and HRV collected:  Yes.   Guidelines for Calcium, Vitamin D, regular exercise program including cardiovascular and weight bearing exercise. Medication refills:  NA Patient with provide copy of the operative report and the pathology report from her hysterectomy.  We discussed weight loss through Weight [employer]chers and physical activity. Follow up:  1 year and prn.    Additional counseling given.  yes. 30 min  total time was spent for this patient encounter, including preparation, face-to-face counseling with the patient, coordination of care, and documentation of the encounter in addition to doing the breast and pelvic exam and pap.

## 2023-07-02 ENCOUNTER — Encounter: Payer: Self-pay | Admitting: Obstetrics and Gynecology

## 2023-07-02 ENCOUNTER — Other Ambulatory Visit (HOSPITAL_COMMUNITY)
Admission: RE | Admit: 2023-07-02 | Discharge: 2023-07-02 | Disposition: A | Discharge status: Home / Self Care | Payer: Medicare Other | Source: Ambulatory Visit | Attending: Obstetrics and Gynecology | Admitting: Obstetrics and Gynecology

## 2023-07-02 ENCOUNTER — Ambulatory Visit: Payer: Medicare Other | Admitting: Obstetrics and Gynecology

## 2023-07-02 VITALS — BP 128/84 | HR 85 | Ht 65.5 in | Wt 205.0 lb

## 2023-07-02 DIAGNOSIS — Z1151 Encounter for screening for human papillomavirus (HPV): Secondary | ICD-10-CM | POA: Insufficient documentation

## 2023-07-02 DIAGNOSIS — Z9189 Other specified personal risk factors, not elsewhere classified: Secondary | ICD-10-CM | POA: Diagnosis not present

## 2023-07-02 DIAGNOSIS — Z01419 Encounter for gynecological examination (general) (routine) without abnormal findings: Secondary | ICD-10-CM | POA: Insufficient documentation

## 2023-07-02 DIAGNOSIS — Z1272 Encounter for screening for malignant neoplasm of vagina: Secondary | ICD-10-CM | POA: Diagnosis not present

## 2023-07-02 DIAGNOSIS — R635 Abnormal weight gain: Secondary | ICD-10-CM | POA: Diagnosis not present

## 2023-07-02 NOTE — Patient Instructions (Signed)

## 2023-07-04 LAB — CYTOLOGY - PAP
Comment: NEGATIVE
Diagnosis: NEGATIVE
High risk HPV: NEGATIVE

## 2023-07-24 ENCOUNTER — Other Ambulatory Visit: Payer: Self-pay | Admitting: Cardiology

## 2023-07-24 DIAGNOSIS — E7800 Pure hypercholesterolemia, unspecified: Secondary | ICD-10-CM

## 2023-10-03 ENCOUNTER — Other Ambulatory Visit: Payer: Self-pay

## 2023-10-03 ENCOUNTER — Ambulatory Visit: Payer: Medicare Other | Admitting: Oncology

## 2023-10-03 ENCOUNTER — Other Ambulatory Visit: Payer: Medicare Other

## 2023-10-10 ENCOUNTER — Inpatient Hospital Stay: Payer: Self-pay | Admitting: Oncology

## 2023-10-10 ENCOUNTER — Inpatient Hospital Stay: Payer: Medicare Other | Attending: Oncology

## 2023-10-10 VITALS — BP 110/78 | HR 86 | Temp 98.1°F | Resp 18 | Ht 65.5 in | Wt 209.6 lb

## 2023-10-10 DIAGNOSIS — E119 Type 2 diabetes mellitus without complications: Secondary | ICD-10-CM | POA: Insufficient documentation

## 2023-10-10 DIAGNOSIS — C7A020 Malignant carcinoid tumor of the appendix: Secondary | ICD-10-CM | POA: Diagnosis present

## 2023-10-10 DIAGNOSIS — Z860101 Personal history of adenomatous and serrated colon polyps: Secondary | ICD-10-CM | POA: Diagnosis not present

## 2023-10-10 DIAGNOSIS — C7A098 Malignant carcinoid tumors of other sites: Secondary | ICD-10-CM

## 2023-10-10 DIAGNOSIS — M8580 Other specified disorders of bone density and structure, unspecified site: Secondary | ICD-10-CM | POA: Insufficient documentation

## 2023-10-10 DIAGNOSIS — I10 Essential (primary) hypertension: Secondary | ICD-10-CM | POA: Diagnosis not present

## 2023-10-10 NOTE — Progress Notes (Signed)
  [facility] OFFICE PROGRESS NOTE   Diagnosis: Carcinoid tumor  INTERVAL HISTORY:   [name] returns as scheduled.  She feels well.  Good appetite.  She is exercising.  She has hot flashes in the evening.  She reports this is a chronic symptom.  No flushing spells.  No diarrhea.  No abdominal pain or nausea.  Objective:  Vital signs in last 24 hours:  Blood pressure 110/78, pulse 86, temperature 98.1 F (36.7 C), temperature source Temporal, resp. rate 18, height 5' 5.5" (1.664 m), weight 209 lb 9.6 oz (95.1 kg), SpO2 100%.   Lymphatics: No cervical, supraclavicular, axillary, or inguinal nodes Resp: Lungs clear bilaterally Cardio: Regular rate and rhythm, no murmur GI: No hepatosplenomegaly, no mass, nontender, no apparent ascites Vascular: No leg edema   CMP  Lab Results  Component Value Date   NA 143 10/26/2020   K 4.8 10/26/2020   CL 104 10/26/2020   CO2 23 10/26/2020   GLUCOSE 118 (H) 10/26/2020   BUN 10 10/26/2020   CREATININE 0.77 10/26/2020   CALCIUM 9.2 10/26/2020   PROT 6.0 10/26/2020   ALBUMIN 4.3 10/26/2020   AST 13 10/26/2020   ALT 16 10/26/2020   ALKPHOS 86 10/26/2020   BILITOT 0.2 10/26/2020   GFRNONAA 67 09/08/2020   GFRAA 77 09/08/2020   Medications: I have reviewed the patient's current medications.   Assessment/Plan: Carcinoid tumor of the appendix TAH/BSO/debulking of endometriosis, partial colpectomy, and appendectomy 12/05/2005-multiple small bowel lesions present, 2 mm well-differentiated neuroendocrine tumor at the tip of the appendix within the submucosa, small bowel excisional biopsy with a small focus of neuroendocrine tumor surrounded by plasmacytic infiltrate with associated microcalcifications 01/08/2006-24-hour urine 5 HIAA-normal, mildly elevated chromogranin A at 285, negative octreotide scan, followed with observation Periodic CT enterography negative-most recent 07/31/2009 11 /20/2018-dotatate PET at [facility]-one 0.9 cm  retroperitoneal nodule with no other site of active disease 06/12/2017-colonoscopy negative for carcinoid involvement 09/24/2018-dotatate PET-no active disease, previously reported 0.9 cm retroperitoneal nodule was likely misregistered pancreas 12/09/2019-surveillance MRI abdomen/pelvis-NED 11/17/2020-MRI abdomen/pelvis-no evidence of metastatic disease Osteopenia Hypertension Diabetes Colonoscopy 06/12/2017-adenomatous polyp removed from the cecum Colonoscopy 06/26/2022-multiple polyps removed-tubular adenomas and hyperplastic polyps      Disposition: [name] appears stable.  There is no clinical evidence of progressive carcinoid disease.  We will follow-up on the chromogranin a level from today.  She is comfortable not undergoing surveillance imaging unless the chromogranin a level rises or she develops new symptoms. She will return for an office visit in 1 year.  Thornton Papas, MD  10/10/2023  10:38 AM

## 2023-10-13 LAB — CHROMOGRANIN A: Chromogranin A (ng/mL): 212.6 ng/mL — ABNORMAL HIGH (ref 0.0–101.8)

## 2023-10-15 ENCOUNTER — Telehealth: Payer: Self-pay

## 2023-10-15 ENCOUNTER — Other Ambulatory Visit: Payer: Self-pay

## 2023-10-15 DIAGNOSIS — C7A098 Malignant carcinoid tumors of other sites: Secondary | ICD-10-CM

## 2023-10-15 NOTE — Telephone Encounter (Signed)
 Patient gave verbal understanding and had no further questions or concerns

## 2023-10-15 NOTE — Telephone Encounter (Signed)
-----   Message from Thornton Papas sent at 10/14/2023  4:22 PM EDT ----- Please call patient, the chromogranin a level is mildly elevated, can repeat in 2 months if higher refer for CTs or dotatate PET.

## 2023-12-16 ENCOUNTER — Inpatient Hospital Stay: Attending: Oncology

## 2023-12-16 DIAGNOSIS — C7A020 Malignant carcinoid tumor of the appendix: Secondary | ICD-10-CM | POA: Insufficient documentation

## 2023-12-16 DIAGNOSIS — C7A098 Malignant carcinoid tumors of other sites: Secondary | ICD-10-CM

## 2023-12-17 LAB — CHROMOGRANIN A: Chromogranin A (ng/mL): 162.3 ng/mL — ABNORMAL HIGH (ref 0.0–101.8)

## 2023-12-23 ENCOUNTER — Ambulatory Visit: Payer: Self-pay | Admitting: Oncology

## 2023-12-24 ENCOUNTER — Other Ambulatory Visit: Payer: Self-pay

## 2023-12-24 DIAGNOSIS — C7A098 Malignant carcinoid tumors of other sites: Secondary | ICD-10-CM

## 2023-12-24 NOTE — Telephone Encounter (Signed)
-----   Message from Coni Deep sent at 12/23/2023  8:38 PM EDT ----- Please call patient, the chromogranin level is lower and still mildly elevated, not significantly changed over the past year, repeat chromogranin in 4 months, call for new symptoms, f/u as scheduled

## 2023-12-24 NOTE — Telephone Encounter (Signed)
 Understood Chromogranin level, also made aware scheduler will call to set up repeat chromogranin in 4 months. No further questions.   Order placed, sent scheduling message to repeat lab in 4 months .

## 2024-01-15 ENCOUNTER — Ambulatory Visit: Payer: Self-pay | Admitting: Nurse Practitioner

## 2024-01-15 ENCOUNTER — Ambulatory Visit (INDEPENDENT_AMBULATORY_CARE_PROVIDER_SITE_OTHER): Admitting: Nurse Practitioner

## 2024-01-15 ENCOUNTER — Other Ambulatory Visit

## 2024-01-15 ENCOUNTER — Encounter: Payer: Self-pay | Admitting: Nurse Practitioner

## 2024-01-15 VITALS — BP 124/80 | HR 96 | Ht 64.5 in | Wt 197.1 lb

## 2024-01-15 DIAGNOSIS — K219 Gastro-esophageal reflux disease without esophagitis: Secondary | ICD-10-CM | POA: Diagnosis not present

## 2024-01-15 DIAGNOSIS — Z860100 Personal history of colon polyps, unspecified: Secondary | ICD-10-CM

## 2024-01-15 DIAGNOSIS — R1310 Dysphagia, unspecified: Secondary | ICD-10-CM

## 2024-01-15 DIAGNOSIS — R197 Diarrhea, unspecified: Secondary | ICD-10-CM

## 2024-01-15 LAB — CBC WITH DIFFERENTIAL/PLATELET
Basophils Absolute: 0.1 10*3/uL (ref 0.0–0.1)
Basophils Relative: 0.9 % (ref 0.0–3.0)
Eosinophils Absolute: 0.2 10*3/uL (ref 0.0–0.7)
Eosinophils Relative: 2.7 % (ref 0.0–5.0)
HCT: 43.4 % (ref 36.0–46.0)
Hemoglobin: 14.6 g/dL (ref 12.0–15.0)
Lymphocytes Relative: 27.1 % (ref 12.0–46.0)
Lymphs Abs: 2.2 10*3/uL (ref 0.7–4.0)
MCHC: 33.8 g/dL (ref 30.0–36.0)
MCV: 87 fl (ref 78.0–100.0)
Monocytes Absolute: 0.6 10*3/uL (ref 0.1–1.0)
Monocytes Relative: 6.9 % (ref 3.0–12.0)
Neutro Abs: 5 10*3/uL (ref 1.4–7.7)
Neutrophils Relative %: 62.4 % (ref 43.0–77.0)
Platelets: 342 10*3/uL (ref 150.0–400.0)
RBC: 4.98 Mil/uL (ref 3.87–5.11)
RDW: 13.1 % (ref 11.5–15.5)
WBC: 8 10*3/uL (ref 4.0–10.5)

## 2024-01-15 LAB — COMPREHENSIVE METABOLIC PANEL WITH GFR
ALT: 17 U/L (ref 0–35)
AST: 17 U/L (ref 0–37)
Albumin: 4.6 g/dL (ref 3.5–5.2)
Alkaline Phosphatase: 64 U/L (ref 39–117)
BUN: 27 mg/dL — ABNORMAL HIGH (ref 6–23)
CO2: 33 meq/L — ABNORMAL HIGH (ref 19–32)
Calcium: 9.7 mg/dL (ref 8.4–10.5)
Chloride: 97 meq/L (ref 96–112)
Creatinine, Ser: 0.86 mg/dL (ref 0.40–1.20)
GFR: 68.58 mL/min (ref >60.00)
Glucose, Bld: 101 mg/dL — ABNORMAL HIGH (ref 70–99)
Potassium: 4 meq/L (ref 3.5–5.1)
Sodium: 138 meq/L (ref 135–145)
Total Bilirubin: 0.6 mg/dL (ref 0.2–1.2)
Total Protein: 7.4 g/dL (ref 6.0–8.3)

## 2024-01-15 MED ORDER — PANTOPRAZOLE SODIUM 40 MG PO TBEC: 40.0000 mg | DELAYED_RELEASE_TABLET | Freq: Every day | ORAL | 1 refills | Status: DC

## 2024-01-15 NOTE — Patient Instructions (Addendum)
 You have been scheduled for an endoscopy. Please follow written instructions given to you at your visit today.  If you use inhalers (even only as needed), please bring them with you on the day of your procedure.  If you take any of the following medications, they will need to be adjusted prior to your procedure:   DO NOT TAKE 7 DAYS PRIOR TO TEST- Trulicity (dulaglutide) Ozempic, Wegovy (semaglutide) Mounjaro (tirzepatide) Bydureon Bcise (exanatide extended release)  DO NOT TAKE 1 DAY PRIOR TO YOUR TEST Rybelsus (semaglutide) Adlyxin (lixisenatide) Victoza (liraglutide) Byetta (exanatide) ___________________________________________________________________________   Your provider has requested that you go to the basement level for lab work before leaving today. Press B on the elevator. The lab is located at the first door on the left as you exit the elevator.  Take Gaviscon liquid over the counter three times daily as needed.   Your provider has ordered Di[facility] stool testing for you. You have received a kit from our office today containing all necessary supplies to complete this test. Please carefully read the stool collection instructions provided in the kit before opening the accompanying materials. In addition, be sure there is a label providing your full name and date of birth on the puritan opti-swab tube that is supplied in the kit (if you do not see a label with this information on your test tube, please make us  aware before test collection!). After completing the test, you should secure the purtian tube into the specimen biohazard bag. The [facility] E-Req sheet (including date and time of specimen collection) should be placed into the outside pocket of the specimen biohazard bag and returned to the [facility] (basement floor of [facility]) within 3 days of collection. Please make sure to give the specimen to a staff member at the lab. DO NOT  leave the specimen on the counter.   If the specimen date and time (can be found in the upper right boxed portion of the sheet) are not filled out on the E-Req sheet, the test will NOT be performed.   _______________________________________________________  If your blood pressure at your visit was 140/90 or greater, please contact your primary care physician to follow up on this.  _______________________________________________________  If you are age 70 or older, your body mass index should be between 23-30. Your Body mass index is 33.31 kg/m. If this is out of the aforementioned range listed, please consider follow up with your Primary Care Provider.  If you are age 66 or younger, your body mass index should be between 19-25. Your Body mass index is 33.31 kg/m. If this is out of the aformentioned range listed, please consider follow up with your Primary Care Provider.   ________________________________________________________  The [facility] providers would like to encourage you to use MYCHART to communicate with providers for non-urgent requests or questions.  Due to long hold times on the telephone, sending your provider a message by [facility] may be a faster and more efficient way to get a response.  Please allow 48 business hours for a response.  Please remember that this is for non-urgent requests.  _______________________________________________________   Thank you for trusting me with your gastrointestinal care!   Elida Shawl, CRNP

## 2024-01-15 NOTE — Progress Notes (Signed)
 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 Previously followed by [facility] gastroenterology and oncology then subsequently transferred her GI management with Dr. Stacia and oncology management with Dr. Cloretta.   She presents today for further evaluation regarding dysphagia x 3 months. She describes having dysphagia, food gets stuck to the upper mid esophagus which sometimes resulted in esophageal pain which radiated to the RUQ and active heartburn. Episodes of dysphagia occur daily, usually during her main meal at 4 or 5pm. She has constant burping. No specific food triggers. She stopped taking Meloxicam 1 to 2 months ago. She also endorses having diarrhea x 2 weeks, 4 episodes during the day and several episodes at night time which has decreased to one episode daily for the past 2 days. No recent antibiotics. No new medications within the past 6 months. No lower abdominal pain. No flushing. She underwent a colonoscopy 06/2022 which identified 4 polyps removed from the colon and rectum, path report consistent with tubular adenomas, hyperplastic/serrated polyps and diverticulosis.   MRI abdomen/pelvis 11/17/2020: No evidence of metastatic disease   PAST GI PROCEDURES:   Colonoscopy 06/26/2022: - One 7 mm polyp in the cecum, removed with a cold snare. Resected and retrieved.  - Two 3 to 4 mm polyps in the sigmoid colon, removed with a cold snare. Resected and retrieved.  - One 4 mm polyp in the distal rectum, removed with a cold snare. Resected and retrieved.  - Severe diverticulosis in the sigmoid colon, in the  descending colon, in the transverse colon and in the ascending colon. There was no evidence of diverticular bleeding.  - The examined portion of the ileum was normal.  - The distal rectum and anal verge are normal on retroflexion view. - Recall colonoscopy 7 years  1. Surgical [P], colon, cecum, sigmoid, polyp (3) - TUBULAR ADENOMA, FRAGMENTS. - HYPERPLASTIC/SERRATED POLYP MOST CONSISTENT WITH A HYPERPLASTIC POLYP WITH PROLAPSE TYPE EFFECT. 2. Surgical [P], colon, rectum, polyp (1) - HYPERPLASTIC POLYP WITH PROLAPSE EFFECT.  Past Medical History:  Diagnosis Date   Allergy    Arthritis    Benign carcinoid tumor of small intestine 2007   Cataract 2023   Depression    DES exposure in utero    Diabetes mellitus without complication (HCC)    Diverticulosis of colon    Endometriosis    Essential tremor    GERD (gastroesophageal reflux disease)    Hx of adenomatous polyp of colon 05/2017   Hyperlipidemia    Hypertension    Neuroendocrine carcinoma of appendix (HCC)    Obesity    Osteopenia    Osteoporosis    Prediabetes    Past Surgical History:  Procedure Laterality Date   APPENDECTOMY  11/2005   BUNIONECTOMY     CATARACT EXTRACTION Right 02/06/2022   CATARACT EXTRACTION Left 02/20/2022   OOPHORECTOMY     TOTAL ABDOMINAL HYSTERECTOMY  2007   TOTAL KNEE ARTHROPLASTY Left 11/13/2021   Current Outpatient Medications on File Prior to Visit  Medication Sig Dispense Refill   alendronate (FOSAMAX) 70 MG tablet 1 tablet 30 minutes before the first food, beverage or medicine of the day with plain water Orally once a week  amitriptyline (ELAVIL) 25 MG tablet Take 25-50 mg by mouth at bedtime.     atorvastatin  (LIPITOR) 10 MG tablet TAKE 1 TABLET BY MOUTH EVERY NIGHT AT BEDTIME 90 tablet 2   b complex vitamins capsule Take 1 capsule by mouth daily.     buPROPion (WELLBUTRIN XL) 300 MG 24 hr tablet Take 300 mg by mouth daily.     cetirizine (ZYRTEC) 10 MG tablet Take 10 mg by mouth  at bedtime.     Cholecalciferol (VITAMIN D3) 50 MCG (2000 UT) CAPS Take 2,000 Units by mouth daily.     fexofenadine (ALLEGRA) 180 MG tablet Take 180 mg by mouth as needed. Alternating with cetirizine     fluticasone (FLONASE) 50 MCG/ACT nasal spray Place 2 sprays into both nostrils daily.     furosemide (LASIX) 20 MG tablet Take 1 tablet by mouth as needed.     Magnesium Oxide -Mg Supplement 500 MG TABS Take 2 tablets by mouth nightly Oral     meloxicam (MOBIC) 15 MG tablet Take 15 mg by mouth daily.     metFORMIN (GLUCOPHAGE) 1000 MG tablet Take 1,000 mg by mouth 2 (two) times daily.     propranolol (INDERAL) 20 MG tablet Take 20 mg by mouth daily.     No current facility-administered medications on file prior to visit.   Allergies  Allergen Reactions   Codeine Itching   Sulfa Antibiotics Diarrhea and Nausea And Vomiting   Current Medications, Allergies, Past Medical History, Past Surgical History, Family History and Social History were reviewed in [employer] record.  Review of Systems:   Constitutional: Negative for fever, sweats, chills or weight loss.  Respiratory: Negative for shortness of breath.   Cardiovascular: Negative for chest pain, palpitations and leg swelling.  Gastrointestinal: See HPI.  Musculoskeletal: Negative for back pain or muscle aches.  Neurological: Negative for dizziness, headaches or paresthesias.   Physical Exam: BP 124/80 (BP Location: Left Arm, Patient Position: Sitting, Cuff Size: Large)   Pulse 96   Ht 5' 4.5 (1.638 m) Comment: height measured without shoes  Wt 197 lb 2 oz (89.4 kg)   BMI 33.31 kg/m  Wt Readings from Last 3 Encounters:  01/15/24 197 lb 2 oz (89.4 kg)  10/10/23 209 lb 9.6 oz (95.1 kg)  07/02/23 205 lb (93 kg)    General: 68 year old female in no acute distress. Head: Normocephalic and atraumatic. Eyes: No scleral icterus. Conjunctiva pink . Ears: Normal auditory acuity. Mouth: Dentition intact. No  ulcers or lesions.  Lungs: Clear throughout to auscultation. Heart: Regular rate and rhythm, no murmur. Abdomen: Soft, nontender and nondistended. No masses or hepatomegaly. Normal bowel sounds x 4 quadrants.  Rectal: Deferred.  Musculoskeletal: Symmetrical with no gross deformities. Extremities: No edema. Neurological: Alert oriented x 4. No focal deficits.  Psychological: Alert and cooperative. Normal mood and affect  Assessment and Recommendations:  68 year old female with a history of GERD with dysphagia x 3 months  -EGD with possible esophageal dilatation benefits and risks discussed including risk with sedation, risk of bleeding, perforation and infection  -Continue Pantoprazole  40mg  every day  -Gaviscon 1 tablespoon tid PRN -Further recommendations to be determined after EGD completed -Patient to contact our office if symptoms worsen prior to EGD date  Diarrhea x 2 weeks, improving. Last chromogranin level 162.3 on 12/16/2023.  -[facility] GI pathogen panel including C. Diff  -Chromogranin A level per Dr. 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 Diarrhea x 2 weeks, no abdominal pain or flushing . -Monitor diarrhea -See plan   History of colon polyps. Colonoscopy 06/2022 identified 4 polyps removed from the colon and rectum, path report consistent with 2 tubular adenomas and 2 hyperplastic polyps removed from the colon and rectum.  -Next colonoscopy due 06/2029.

## 2024-01-18 DIAGNOSIS — R1310 Dysphagia, unspecified: Secondary | ICD-10-CM | POA: Insufficient documentation

## 2024-01-21 ENCOUNTER — Encounter: Payer: Self-pay | Admitting: Oncology

## 2024-01-22 ENCOUNTER — Telehealth: Payer: Self-pay

## 2024-01-22 ENCOUNTER — Telehealth: Payer: Self-pay | Admitting: Oncology

## 2024-01-22 NOTE — Telephone Encounter (Signed)
 Returning missed call.

## 2024-01-22 NOTE — Telephone Encounter (Signed)
 Attempted to reach patient multiple times, left a voicemail to call [facility] to follow up on a message that was left on mychart.   48- Spoke with patient, reported  Started seeing Dr. Stacia at [facility] on previous/current symptoms, was prescribed Protonix , Gaviscon. Endoscopy scheduled for August 8th, Denies having blood in urine/ stools, (brown in color), N/V, Fever, sweating, hot/cold chills, chest pain. Reports heartburn, belching have subsided. Patient stated wants Dr. Cloretta to be aware of current status. Printed out/ routing message to provider to review.

## 2024-01-23 NOTE — Progress Notes (Signed)
 Agree with the assessment and plan as outlined by Alcide Evener, NP.    Zae Kirtz E. Tomasa Rand, MD [facility]

## 2024-02-16 ENCOUNTER — Telehealth: Payer: Self-pay | Admitting: Nurse Practitioner

## 2024-02-16 NOTE — Telephone Encounter (Signed)
 GI pathogen panel 01/19/2024 was negative.

## 2024-02-20 ENCOUNTER — Encounter: Payer: Self-pay | Admitting: Gastroenterology

## 2024-02-20 ENCOUNTER — Ambulatory Visit: Admitting: Gastroenterology

## 2024-02-20 VITALS — BP 103/63 | HR 72 | Temp 96.4°F | Resp 17 | Ht 64.0 in | Wt 197.0 lb

## 2024-02-20 DIAGNOSIS — K449 Diaphragmatic hernia without obstruction or gangrene: Secondary | ICD-10-CM

## 2024-02-20 DIAGNOSIS — K222 Esophageal obstruction: Secondary | ICD-10-CM | POA: Diagnosis not present

## 2024-02-20 DIAGNOSIS — K2289 Other specified disease of esophagus: Secondary | ICD-10-CM

## 2024-02-20 DIAGNOSIS — K219 Gastro-esophageal reflux disease without esophagitis: Secondary | ICD-10-CM

## 2024-02-20 DIAGNOSIS — R1310 Dysphagia, unspecified: Secondary | ICD-10-CM

## 2024-02-20 MED ORDER — SODIUM CHLORIDE 0.9 % IV SOLN: 500.0000 mL | Freq: Once | INTRAVENOUS | Status: DC

## 2024-02-20 NOTE — Op Note (Signed)
 [facility] Patient Name: Lauren [name] Procedure Date: 02/20/2024 2:56 PM MRN: [id number] Endoscopist: Glendia E. Stacia , MD, [id number] Age: 68 Referring MD:  Date of Birth: 04/05/1956 Gender: Female Account #: [id number] Procedure:                Upper GI endoscopy Indications:              Dysphagia Medicines:                Monitored Anesthesia Care Procedure:                Pre-Anesthesia Assessment:                           - Prior to the procedure, a History and Physical                            was performed, and patient medications and                            allergies were reviewed. The patient's tolerance of                            previous anesthesia was also reviewed. The risks                            and benefits of the procedure and the sedation                            options and risks were discussed with the patient.                            All questions were answered, and informed consent                            was obtained. Prior Anticoagulants: The patient has                            taken no anticoagulant or antiplatelet agents. ASA                            Grade Assessment: II - A patient with mild systemic                            disease. After reviewing the risks and benefits,                            the patient was deemed in satisfactory condition to                            undergo the procedure.                           After obtaining informed consent, the endoscope was  passed under direct vision. Throughout the                            procedure, the patient's blood pressure, pulse, and                            oxygen saturations were monitored continuously. The                            Olympus Scope [id number] was introduced through the                            mouth, and advanced to the second part of duodenum.                            The upper GI endoscopy was  accomplished without                            difficulty. The patient tolerated the procedure                            well. Scope In: Scope Out: Findings:                 One benign-appearing, intrinsic mild stenosis was                            found in the distal esophagus. This stenosis                            measured 1.3 cm (inner diameter) x 1 cm (in                            length). The stenosis was traversed. A guidewire                            was placed and the scope was withdrawn. Dilation                            was performed with a Savary dilator with no                            resistance at 17 mm. The dilation site was examined                            following endoscope reinsertion and showed no                            change. A guidewire was placed and the scope was                            withdrawn. Dilation was performed with a Savary  dilator with mild resistance at 19 mm. The dilation                            site was examined following endoscope reinsertion                            and showed no change. Estimated blood loss: none.                           The exam of the esophagus was otherwise normal.                           Biopsies were obtained from the proximal and distal                            esophagus with cold forceps for histology of                            suspected eosinophilic esophagitis. Estimated blood                            loss was minimal.                           A 5 cm hiatal hernia was present.                           The entire examined stomach was normal.                           The examined duodenum was normal. Complications:            No immediate complications. Estimated Blood Loss:     Estimated blood loss was minimal. Impression:               - Benign-appearing esophageal stenosis. Dilated.                           - 5 cm hiatal hernia.                            - Normal stomach.                           - Normal examined duodenum.                           - Biopsies were taken with a cold forceps for                            evaluation of eosinophilic esophagitis. Recommendation:           - Patient has a contact number available for                            emergencies. The signs and symptoms of potential  delayed complications were discussed with the                            patient. Return to normal activities tomorrow.                            Written discharge instructions were provided to the                            patient.                           - Resume previous diet.                           - Continue present medications.                           - Await pathology results.                           - If patient continues to have significant                            dysphagia symptoms, would recommend barium                            esophagram or esophageal manometry for further                            evaluation. Lauren [name] E. Stacia, MD 02/20/2024 3:29:06 PM This report has been signed electronically.

## 2024-02-20 NOTE — Progress Notes (Signed)
 Report to PACU, RN, vss, BBS= Clear.

## 2024-02-20 NOTE — Patient Instructions (Signed)
 Handouts provided about esophagitis, stricture and hiatal hernia. Resume previous diet. Continue present medications. Await pathology results. If patient continues to have significant dysphagia symptoms, would recommend barium esophagram or esophageal manometry for further evaluation.  YOU HAD AN ENDOSCOPIC PROCEDURE TODAY AT THE [facility]:   Refer to the procedure report that was given to you for any specific questions about what was found during the examination.  If the procedure report does not answer your questions, please call your gastroenterologist to clarify.  If you requested that your care partner not be given the details of your procedure findings, then the procedure report has been included in a sealed envelope for you to review at your convenience later.  YOU SHOULD EXPECT: Some feelings of bloating in the abdomen. Passage of more gas than usual.  Walking can help get rid of the air that was put into your GI tract during the procedure and reduce the bloating. If you had a lower endoscopy (such as a colonoscopy or flexible sigmoidoscopy) you may notice spotting of blood in your stool or on the toilet paper. If you underwent a bowel prep for your procedure, you may not have a normal bowel movement for a few days.  Please Note:  You might notice some irritation and congestion in your nose or some drainage.  This is from the oxygen used during your procedure.  There is no need for concern and it should clear up in a day or so.  SYMPTOMS TO REPORT IMMEDIATELY: Following upper endoscopy (EGD)  Vomiting of blood or coffee ground material  New chest pain or pain under the shoulder blades  Painful or persistently difficult swallowing  New shortness of breath  Fever of 100F or higher  Black, tarry-looking stools  For urgent or emergent issues, a gastroenterologist can be reached at any hour by calling [phone]. Do not use MyChart messaging for urgent concerns.     DIET:  We do recommend a small meal at first, but then you may proceed to your regular diet.  Drink plenty of fluids but you should avoid alcoholic beverages for 24 hours.  ACTIVITY:  You should plan to take it easy for the rest of today and you should NOT DRIVE or use heavy machinery until tomorrow (because of the sedation medicines used during the test).    FOLLOW UP: Our staff will call the number listed on your records the next business day following your procedure.  We will call around 7:15- 8:00 am to check on you and address any questions or concerns that you may have regarding the information given to you following your procedure. If we do not reach you, we will leave a message.     If any biopsies were taken you will be contacted by phone or by letter within the next 1-3 weeks.  Please call us  at [phone] if you have not heard about the biopsies in 3 weeks.    SIGNATURES/CONFIDENTIALITY: You and/or your care partner have signed paperwork which will be entered into your electronic medical record.  These signatures attest to the fact that that the information above on your After Visit Summary has been reviewed and is understood.  Full responsibility of the confidentiality of this discharge information lies with you and/or your care-partner.

## 2024-02-20 NOTE — Progress Notes (Signed)
 [facility] History and Physical   Primary Care Physician:  Stephane Leita DEL, MD   Reason for Procedure:   Dysphagia  Plan:    EGD with possible dilation     HPI: Lauren [name] is a 68 y.o. female undergoing EGD to evaluate persistent dysphagia for the past 4-5 months.  No GERD symptoms.  She has lost 18 lbs due to dietary modifications she made to lessen her dysphagia symptoms (eating mostly a liquid diet).  No abdominal pain, nausea/vomiting   Past Medical History:  Diagnosis Date   Allergy    Arthritis    Benign carcinoid tumor of small intestine 2007   Cataract 2023   Depression    DES exposure in utero    Diabetes mellitus without complication (HCC)    Diverticulosis of colon    Endometriosis    Essential tremor    GERD (gastroesophageal reflux disease)    Hx of adenomatous polyp of colon 05/2017   Hyperlipidemia    Hypertension    Neuroendocrine carcinoma of appendix (HCC)    Obesity    Osteopenia    Osteoporosis    Prediabetes     Past Surgical History:  Procedure Laterality Date   APPENDECTOMY  11/2005   BUNIONECTOMY     CATARACT EXTRACTION Right 02/06/2022   CATARACT EXTRACTION Left 02/20/2022   OOPHORECTOMY     TOTAL ABDOMINAL HYSTERECTOMY  2007   TOTAL KNEE ARTHROPLASTY Left 11/13/2021    Prior to Admission medications   Medication Sig Start Date End Date Taking? Authorizing Provider  alendronate (FOSAMAX) 70 MG tablet 1 tablet 30 minutes before the first food, beverage or medicine of the day with plain water Orally once a week 05/16/23  Yes [provider]  amitriptyline (ELAVIL) 25 MG tablet Take 25-50 mg by mouth at bedtime. 11/20/23 05/18/24 Yes [provider]  amoxicillin (AMOXIL) 500 MG capsule Take 1,000 mg by mouth 2 (two) times daily. Pre-Dental 02/10/24  Yes [provider]  atorvastatin  (LIPITOR) 10 MG tablet TAKE 1 TABLET BY MOUTH EVERY NIGHT AT BEDTIME 07/24/23  Yes Tolia, Sunit, DO  b complex vitamins  capsule Take 1 capsule by mouth daily.   Yes [provider]  buPROPion (WELLBUTRIN XL) 300 MG 24 hr tablet Take 300 mg by mouth daily. 10/03/23  Yes [provider]  cetirizine (ZYRTEC) 10 MG tablet Take 10 mg by mouth at bedtime.   Yes [provider]  Cholecalciferol (VITAMIN D3) 50 MCG (2000 UT) CAPS Take 2,000 Units by mouth daily.   Yes [provider]  fexofenadine (ALLEGRA) 180 MG tablet Take 180 mg by mouth as needed. Alternating with cetirizine   Yes [provider]  furosemide (LASIX) 20 MG tablet Take 1 tablet by mouth as needed. 07/13/19  Yes [provider]  Magnesium Oxide -Mg Supplement 500 MG TABS Take 2 tablets by mouth nightly Oral 02/01/20  Yes [provider]  metFORMIN (GLUCOPHAGE) 1000 MG tablet Take 1,000 mg by mouth 2 (two) times daily. 12/27/23  Yes [provider]  pantoprazole  (PROTONIX ) 40 MG tablet Take 1 tablet (40 mg total) by mouth daily. 01/15/24  Yes Kennedy-Smith, Colleen M, NP  propranolol (INDERAL) 20 MG tablet Take 20 mg by mouth daily. 01/27/21  Yes [provider]  fluticasone (FLONASE) 50 MCG/ACT nasal spray Place 2 sprays into both nostrils daily. Patient not taking: Reported on 02/20/2024    [provider]  meloxicam (MOBIC) 15 MG tablet Take 15 mg by  mouth daily. Patient not taking: Reported on 02/20/2024    [provider]    Current Outpatient Medications  Medication Sig Dispense Refill   alendronate (FOSAMAX) 70 MG tablet 1 tablet 30 minutes before the first food, beverage or medicine of the day with plain water Orally once a week     amitriptyline (ELAVIL) 25 MG tablet Take 25-50 mg by mouth at bedtime.     amoxicillin (AMOXIL) 500 MG capsule Take 1,000 mg by mouth 2 (two) times daily. Pre-Dental     atorvastatin  (LIPITOR) 10 MG tablet TAKE 1 TABLET BY MOUTH EVERY NIGHT AT BEDTIME 90 tablet 2   b complex vitamins capsule Take 1 capsule by mouth daily.      buPROPion (WELLBUTRIN XL) 300 MG 24 hr tablet Take 300 mg by mouth daily.     cetirizine (ZYRTEC) 10 MG tablet Take 10 mg by mouth at bedtime.     Cholecalciferol (VITAMIN D3) 50 MCG (2000 UT) CAPS Take 2,000 Units by mouth daily.     fexofenadine (ALLEGRA) 180 MG tablet Take 180 mg by mouth as needed. Alternating with cetirizine     furosemide (LASIX) 20 MG tablet Take 1 tablet by mouth as needed.     Magnesium Oxide -Mg Supplement 500 MG TABS Take 2 tablets by mouth nightly Oral     metFORMIN (GLUCOPHAGE) 1000 MG tablet Take 1,000 mg by mouth 2 (two) times daily.     pantoprazole  (PROTONIX ) 40 MG tablet Take 1 tablet (40 mg total) by mouth daily. 30 tablet 1   propranolol (INDERAL) 20 MG tablet Take 20 mg by mouth daily.     fluticasone (FLONASE) 50 MCG/ACT nasal spray Place 2 sprays into both nostrils daily. (Patient not taking: Reported on 02/20/2024)     meloxicam (MOBIC) 15 MG tablet Take 15 mg by mouth daily. (Patient not taking: Reported on 02/20/2024)     Current Facility-Administered Medications  Medication Dose Route Frequency Provider Last Rate Last Admin   0.9 %  sodium chloride  infusion  500 mL Intravenous Once Stacia Glendia BRAVO, MD        Allergies as of 02/20/2024 - Review Complete 02/20/2024  Allergen Reaction Noted   Codeine Itching 10/31/2016   Sulfa antibiotics Diarrhea and Nausea And Vomiting 12/31/2019    Family History  Problem Relation Age of Onset   Breast cancer Mother    Lung cancer Mother    Alcoholism Mother    Arthritis Mother    Bone cancer Father    Brain cancer Father    Hypertension Father    Lung cancer Father    Throat cancer Brother    Lung cancer Brother    Other Brother        Neuroendocrine tumors   Bone cancer Brother    Hodgkin's lymphoma Brother    Diabetes Maternal Grandfather    Lung cancer Paternal Grandmother    Stomach cancer Paternal Grandfather    Rectal cancer Neg Hx    Colon cancer Neg Hx     Social History    Socioeconomic History   Marital status: Married    Spouse name: Not on file   Number of children: 0   Years of education: Not on file   Highest education level: Not on file  Occupational History   Occupation: Retired Charity fundraiser  Tobacco Use   Smoking status: Never   Smokeless tobacco: Never  Vaping Use   Vaping status: Never Used  Substance and Sexual Activity  Alcohol use: Not Currently   Drug use: Never   Sexual activity: Not Currently    Comment: less than 5, IC after 14, no STD, no abnormal, DES exposure  Other Topics Concern   Not on file  Social History Narrative   2 CHILDREN: Both adopted, patient stated that she was unable to have children.   Social Drivers of Corporate investment banker Strain: Low Risk  (11/20/2023)   Received from [facility]   Overall Financial Resource Strain (CARDIA)    Difficulty of Paying Living Expenses: Not hard at all  Food Insecurity: No Food Insecurity (11/20/2023)   Received from [facility]   Hunger Vital Sign    Within the past 12 months, you worried that your food would run out before you got the money to buy more.: Never true    Within the past 12 months, the food you bought just didn't last and you didn't have money to get more.: Never true  Transportation Needs: No Transportation Needs (11/20/2023)   Received from [facility] - Transportation    Lack of Transportation (Medical): No    Lack of Transportation (Non-Medical): No  Physical Activity: Sufficiently Active (06/22/2018)   Received from [facility]   Exercise Vital Sign    Days of Exercise per Week: 5 days    Minutes of Exercise per Session: 30 min  Stress: Stress Concern Present (06/22/2018)   Received from [facility] - Occupational Stress Questionnaire    Feeling of Stress : To some extent  Social Connections: Unknown (11/27/2021)   Received from [facility]   Social Network    Social Network: Not on file   Intimate Partner Violence: Unknown (10/19/2021)   Received from [facility]   HITS    Physically Hurt: Not on file    Insult or Talk Down To: Not on file    Threaten Physical Harm: Not on file    Scream or Curse: Not on file    Review of Systems:  All other review of systems negative except as mentioned in the HPI.  Physical Exam: Vital signs BP (!) 129/93   Pulse 79   Temp (!) 96.4 F (35.8 C) (Temporal)   Ht 5' 4 (1.626 m)   Wt 197 lb (89.4 kg)   SpO2 96%   BMI 33.81 kg/m   General:   Alert,  Well-developed, well-nourished, pleasant and cooperative in NAD Airway:  Mallampati 3 Lungs:  Clear throughout to auscultation.   Heart:  Regular rate and rhythm; no murmurs, clicks, rubs,  or gallops. Abdomen:  Soft, nontender and nondistended. Normal bowel sounds.   Neuro/Psych:  Normal mood and affect. A and O x 3   Lauren [name] E. Stacia, MD [facility]

## 2024-02-20 NOTE — Progress Notes (Signed)
 Called to room to assist during endoscopic procedure.  Patient ID and intended procedure confirmed with present staff. Received instructions for my participation in the procedure from the performing physician.

## 2024-02-23 ENCOUNTER — Telehealth: Payer: Self-pay

## 2024-02-23 NOTE — Telephone Encounter (Signed)
  Follow up Call-     02/20/2024    2:03 PM 06/26/2022    7:56 AM  Call back number  Post procedure Call Back phone  # [phone]  Permission to leave phone message Yes Yes     Patient questions:  Do you have a fever, pain , or abdominal swelling? No. Pain Score  0 *  Have you tolerated food without any problems? Yes.    Have you been able to return to your normal activities? Yes.    Do you have any questions about your discharge instructions: Diet   No. Medications  No. Follow up visit  No.  Do you have questions or concerns about your Care? No.  Actions: * If pain score is 4 or above: No action needed, pain <4.

## 2024-02-25 LAB — SURGICAL PATHOLOGY

## 2024-02-29 ENCOUNTER — Ambulatory Visit: Payer: Self-pay | Admitting: Gastroenterology

## 2024-02-29 NOTE — Progress Notes (Signed)
 Ms. Andra, The biopsies of your distal esophagus showed mild changes suggestive of acid reflux.  The biopsies of your proximal esophagus were normal.  There was no evidence of eosinophilic esophagitis to explain your swallowing symptoms. I would continue your pantoprazole , and if you continue to have significant swallowing symptoms, we can get one of the studies we discussed at the time of your upper endoscopy (esophageal manometry or barium esophagram).  Please let us  know if you would like to proceed with additional testing.

## 2024-03-11 ENCOUNTER — Other Ambulatory Visit: Payer: Self-pay | Admitting: Nurse Practitioner

## 2024-03-11 ENCOUNTER — Encounter: Payer: Self-pay | Admitting: Nurse Practitioner

## 2024-04-19 ENCOUNTER — Other Ambulatory Visit: Payer: Self-pay | Admitting: Cardiology

## 2024-04-19 DIAGNOSIS — E7800 Pure hypercholesterolemia, unspecified: Secondary | ICD-10-CM

## 2024-04-23 ENCOUNTER — Inpatient Hospital Stay: Attending: Oncology

## 2024-04-23 DIAGNOSIS — C7A098 Malignant carcinoid tumors of other sites: Secondary | ICD-10-CM | POA: Insufficient documentation

## 2024-04-25 LAB — CHROMOGRANIN A: Chromogranin A (ng/mL): 254.6 ng/mL — ABNORMAL HIGH (ref 0.0–101.8)

## 2024-04-26 ENCOUNTER — Ambulatory Visit: Payer: Self-pay | Admitting: Oncology

## 2024-04-26 ENCOUNTER — Other Ambulatory Visit: Payer: Self-pay | Admitting: *Deleted

## 2024-04-26 DIAGNOSIS — C7A098 Malignant carcinoid tumors of other sites: Secondary | ICD-10-CM

## 2024-04-26 NOTE — Telephone Encounter (Signed)
 [name] called back and confirmed she had not been taking the pantoprazole  for the test. Agrees to repeat lab late November and appointment scheduled.

## 2024-05-17 ENCOUNTER — Other Ambulatory Visit: Payer: Self-pay | Admitting: Cardiology

## 2024-05-17 DIAGNOSIS — E7800 Pure hypercholesterolemia, unspecified: Secondary | ICD-10-CM

## 2024-06-01 ENCOUNTER — Other Ambulatory Visit: Payer: Self-pay | Admitting: Internal Medicine

## 2024-06-01 DIAGNOSIS — Z1231 Encounter for screening mammogram for malignant neoplasm of breast: Secondary | ICD-10-CM

## 2024-06-04 ENCOUNTER — Other Ambulatory Visit: Payer: Self-pay | Admitting: Cardiology

## 2024-06-04 DIAGNOSIS — E7800 Pure hypercholesterolemia, unspecified: Secondary | ICD-10-CM

## 2024-06-08 ENCOUNTER — Inpatient Hospital Stay: Attending: Oncology

## 2024-06-08 DIAGNOSIS — C7A098 Malignant carcinoid tumors of other sites: Secondary | ICD-10-CM | POA: Insufficient documentation

## 2024-06-09 LAB — CHROMOGRANIN A: Chromogranin A (ng/mL): 311.7 ng/mL — ABNORMAL HIGH (ref 0.0–101.8)

## 2024-06-11 ENCOUNTER — Ambulatory Visit: Payer: Self-pay | Admitting: Oncology

## 2024-06-29 ENCOUNTER — Ambulatory Visit

## 2024-07-05 ENCOUNTER — Ambulatory Visit: Payer: Medicare Other | Admitting: Obstetrics and Gynecology

## 2024-07-05 ENCOUNTER — Encounter: Payer: Self-pay | Admitting: Obstetrics and Gynecology

## 2024-07-05 VITALS — BP 124/82 | HR 84 | Ht 65.5 in | Wt 202.0 lb

## 2024-07-05 DIAGNOSIS — C7A00 Malignant carcinoid tumor of unspecified site: Secondary | ICD-10-CM | POA: Diagnosis not present

## 2024-07-05 DIAGNOSIS — Z91B Personal risk factor of exposure to diethylstilbestrol: Secondary | ICD-10-CM | POA: Diagnosis not present

## 2024-07-05 DIAGNOSIS — Z01419 Encounter for gynecological examination (general) (routine) without abnormal findings: Secondary | ICD-10-CM

## 2024-07-05 DIAGNOSIS — M8580 Other specified disorders of bone density and structure, unspecified site: Secondary | ICD-10-CM

## 2024-07-05 DIAGNOSIS — Z9189 Other specified personal risk factors, not elsewhere classified: Secondary | ICD-10-CM

## 2024-07-05 DIAGNOSIS — Z1272 Encounter for screening for malignant neoplasm of vagina: Secondary | ICD-10-CM | POA: Diagnosis not present

## 2024-07-05 DIAGNOSIS — Z809 Family history of malignant neoplasm, unspecified: Secondary | ICD-10-CM

## 2024-07-05 NOTE — Patient Instructions (Signed)

## 2024-07-05 NOTE — Progress Notes (Signed)
 "  68 y.o. G22P0010 Married Caucasian female here for a breast and pelvic exam.    The patient is also followed for hx DES exposure.  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     Notes some lower back pain, diarrhea, and constipation.    Patient sees Dr. Cloretta at [facility] for metastatic neuroendocrine tumor. Next appointment in March, 2026.   States her brother also dx with carcinoid.  Several family members with hx of various cancers.   Having foot pain.  Hx osteoporosis.  On Fosamax through PCP.   Had both flu and Covid vaccines.    PCP: Stephane Leita DEL, MD   No LMP recorded. Patient has had a hysterectomy.           Sexually active: No.  The current method of family planning is status post hysterectomy.    Menopausal hormone therapy:  n/a Exercising: Yes.    Walking  Smoker:  no  OB History     Gravida  1   Para      Term      Preterm      AB  1   Living         SAB      IAB      Ectopic      Multiple      Live Births           Obstetric Comments  2 CHILDREN: Both adopted, patient stated that she was unable to have children.         HEALTH MAINTENANCE: Last 2 paps: 07/02/23 neg HR HPV neg   History of abnormal Pap or positive HPV:  no Mammogram:  05/08/23 Breast Density Cat A, BIRADS Cat 1 neg.  Has appointment in January.    Colonoscopy:  06/26/22  Bone Density:  05/08/23  Result  osteopenic.  PCP following.      Immunization History  Administered Date(s) Administered   Influenza,inj,Quad PF,6+ Mos 05/16/2015, 05/04/2018   Influenza-Unspecified 05/09/2017, 04/13/2019, 03/15/2022   PFIZER Comirnaty(Gray Top)Covid-19 Tri-Sucrose Vaccine 11/18/2020, 03/27/2021   PFIZER(Purple Top)SARS-COV-2 Vaccination 08/05/2019, 08/26/2019, 04/14/2020    Pfizer Covid-19 Vaccine Bivalent Booster 80yrs & up 03/15/2022   Pfizer(Comirnaty)Fall Seasonal Vaccine 12 years and older 04/12/2022, 12/17/2022, 06/08/2023   Pneumococcal Conjugate-13 05/30/2019   Pneumococcal Polysaccharide-23 05/30/2019   Respiratory Syncytial Virus Vaccine,Recomb Aduvanted(Arexvy) 03/15/2022   Tdap 11/26/2010   Zoster Recombinant(Shingrix) 03/15/2022      reports that she has never smoked. She has never used smokeless tobacco. She reports that she does not currently use alcohol. She reports that she does not use drugs.  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   Cataract 2023   Depression    DES exposure in utero    Diabetes mellitus without complication (HCC)    Diverticulosis of colon    Dysphagia    Endometriosis    Essential tremor    GERD (gastroesophageal reflux disease)    Hx of adenomatous polyp of colon 05/2017   Hyperlipidemia    Hypertension    Neuroendocrine carcinoma of appendix (HCC)    Obesity    Osteopenia    Osteoporosis    Prediabetes     Past Surgical History:  Procedure Laterality Date  APPENDECTOMY  11/2005   BUNIONECTOMY     CATARACT EXTRACTION Right 02/06/2022   CATARACT EXTRACTION Left 02/20/2022   OOPHORECTOMY     TOTAL ABDOMINAL HYSTERECTOMY  2007   TOTAL KNEE ARTHROPLASTY Left 11/13/2021    Current Outpatient Medications  Medication Sig Dispense Refill   alendronate (FOSAMAX) 70 MG tablet 1 tablet 30 minutes before the first food, beverage or medicine of the day with plain water Orally once a week     amitriptyline (ELAVIL) 25 MG tablet Take 25 mg by mouth at bedtime.     atorvastatin  (LIPITOR) 10 MG tablet TAKE 1 TABLET BY MOUTH EVERY NIGHT AT BEDTIME 15 tablet 0   b complex vitamins capsule Take 1 capsule by mouth daily.     buPROPion (WELLBUTRIN XL) 300 MG 24 hr tablet Take 300 mg by mouth daily.     cetirizine (ZYRTEC) 10 MG tablet  Take 10 mg by mouth at bedtime.     Cholecalciferol (VITAMIN D3) 50 MCG (2000 UT) CAPS Take 2,000 Units by mouth daily.     fexofenadine (ALLEGRA) 180 MG tablet Take 180 mg by mouth as needed. Alternating with cetirizine     fluticasone (FLONASE) 50 MCG/ACT nasal spray Place 2 sprays into both nostrils daily.     furosemide (LASIX) 20 MG tablet Take 1 tablet by mouth as needed.     Magnesium Oxide -Mg Supplement 500 MG TABS Take 2 tablets by mouth nightly Oral     metFORMIN (GLUCOPHAGE) 1000 MG tablet Take 1,000 mg by mouth 2 (two) times daily.     pantoprazole  (PROTONIX ) 20 MG tablet 1 tablet 1/2 to 1 hour before morning meal Orally Once a day     propranolol (INDERAL) 20 MG tablet Take 20 mg by mouth daily.     amoxicillin (AMOXIL) 500 MG capsule Take 1,000 mg by mouth 2 (two) times daily. Pre-Dental (Patient not taking: Reported on 07/05/2024)     meloxicam (MOBIC) 15 MG tablet Take 15 mg by mouth daily. (Patient not taking: Reported on 07/05/2024)     No current facility-administered medications for this visit.    ALLERGIES: Codeine and Sulfa antibiotics  Family History  Problem Relation Age of Onset   Breast cancer Mother    Lung cancer Mother    Alcoholism Mother    Arthritis Mother    Bone cancer Father    Brain cancer Father    Hypertension Father    Lung cancer Father    Throat cancer Brother    Lung cancer Brother    Other Brother        Neuroendocrine tumors   Bone cancer Brother    Hodgkin's lymphoma Brother    Diabetes Maternal Grandfather    Lung cancer Paternal Grandmother    Stomach cancer Paternal Grandfather    Rectal cancer Neg Hx    Colon cancer Neg Hx     Review of Systems  All other systems reviewed and are negative.   PHYSICAL EXAM:  BP 124/82 (BP Location: Left Arm, Patient Position: Sitting)   Pulse 84   Ht 5' 5.5 (1.664 m)   Wt 202 lb (91.6 kg)   SpO2 97%   BMI 33.10 kg/m     General appearance: alert, cooperative and appears stated  age Head: normocephalic, without obvious abnormality, atraumatic Neck: no adenopathy, supple, symmetrical, trachea midline and thyroid normal to inspection and palpation Lungs: clear to auscultation bilaterally Breasts: normal appearance, no masses or tenderness, No nipple retraction or dimpling,  No nipple discharge or bleeding, No axillary adenopathy Heart: regular rate and rhythm Abdomen: vertical midline incision.  Abdomen is soft, non-tender; no masses, no organomegaly Extremities: extremities normal, atraumatic, no cyanosis or edema Skin: skin color, texture, turgor normal. No rashes or lesions Lymph nodes: cervical, supraclavicular, and axillary nodes normal. Neurologic: grossly normal  Pelvic: External genitalia:  no lesions              No abnormal inguinal nodes palpated.              Urethra:  normal appearing urethra with no masses, tenderness or lesions              Bartholins and Skenes: normal                 Vagina: normal appearing vagina with normal color and discharge, no lesions              Cervix: absent              Pap taken: no Bimanual Exam:  Uterus:  absent              Adnexa: no mass, fullness, tenderness              Rectal exam: yes.  Confirms.              Anus:  normal sphincter tone, no lesions  Chaperone was present for exam:  Kari HERO, CMA  ASSESSMENT: Encounter for breast and pelvic exam.  High risk Medicare patient.  Status post TAH/BSO for endometriosis.   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  Had concurrent dx of neuroendocrine tumors on her small intestine.  Hx DES exposure. Vaginal cancer screening. Neuroendocrine tumor of the appendix.  Hx osteoporosis.  Current osteopenia of bilateral hips.  On Fosamax.  FH breast cancer in mother.  FH cancer in multiple family members.   PLAN: Mammogram screening discussed. Self breast awareness reviewed. Pap and HRV collected:  yes.  Patient would like to continue screening for  vaginal cancer.  Guidelines for Calcium , Vitamin D, regular exercise program including cardiovascular and weight bearing exercise. Medication refills:  NA We discussed genetic testing through [facility] or through the [facility].  She will consider her options.  She will follow up with her PCP regarding her foot pain.   Follow up:  yearly and prn.    Additional counseling given.  yes. 30 min  total time was spent for this patient encounter, including preparation, face-to-face counseling with the patient, coordination of care, record review, and documentation of the encounter in addition to doing the breast and pelvic exam and pap.        "

## 2024-07-07 LAB — CYTOLOGY - PAP: Adequacy: ABNORMAL

## 2024-07-09 ENCOUNTER — Ambulatory Visit: Payer: Self-pay | Admitting: Obstetrics and Gynecology

## 2024-07-13 ENCOUNTER — Encounter: Payer: Self-pay | Admitting: Cardiology

## 2024-07-13 NOTE — Telephone Encounter (Signed)
"  error  "

## 2024-07-16 NOTE — Progress Notes (Deleted)
 "  GYNECOLOGY  VISIT   HPI: 69 y.o.   Married  Caucasian female   G1P0010 with No LMP recorded. Patient has had a hysterectomy.   here for: Repeat pap      GYNECOLOGIC HISTORY: No LMP recorded. Patient has had a hysterectomy. Contraception:   Menopausal hormone therapy:  n/a Last 2 paps:  07/05/24 un diagnostic, 07/02/23 neg HR HPV neg  History of abnormal Pap or positive HPV:  no Mammogram:  05/08/23 Breast Density Cat A, BIRADS Cat 1 neg.  Has appointment in January.         OB History     Gravida  1   Para      Term      Preterm      AB  1   Living         SAB      IAB      Ectopic      Multiple      Live Births           Obstetric Comments  2 CHILDREN: Both adopted, patient stated that she was unable to have children.            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   Cataract 2023   Depression    DES exposure in utero    Diabetes mellitus without complication (HCC)    Diverticulosis of colon    Dysphagia    Endometriosis    Essential tremor    GERD (gastroesophageal reflux disease)    Hx of adenomatous polyp of colon 05/2017   Hyperlipidemia    Hypertension    Neuroendocrine carcinoma of appendix (HCC)    Obesity    Osteopenia    Osteoporosis    Prediabetes     Past Surgical History:  Procedure Laterality Date   APPENDECTOMY  11/2005   BUNIONECTOMY     CATARACT EXTRACTION Right 02/06/2022   CATARACT EXTRACTION Left 02/20/2022   OOPHORECTOMY     TOTAL ABDOMINAL HYSTERECTOMY  2007   TOTAL KNEE ARTHROPLASTY Left 11/13/2021    Current Outpatient Medications  Medication Sig Dispense Refill   alendronate (FOSAMAX) 70 MG tablet 1 tablet 30 minutes before the first food, beverage or medicine of the day with plain water Orally once a week     amitriptyline (ELAVIL) 25 MG  tablet Take 25 mg by mouth at bedtime.     amoxicillin (AMOXIL) 500 MG capsule Take 1,000 mg by mouth 2 (two) times daily. Pre-Dental (Patient not taking: Reported on 07/05/2024)     atorvastatin  (LIPITOR) 10 MG tablet TAKE 1 TABLET BY MOUTH EVERY NIGHT AT BEDTIME 15 tablet 0   b complex vitamins capsule Take 1 capsule by mouth daily.     buPROPion (WELLBUTRIN XL) 300 MG 24 hr tablet Take 300 mg by mouth daily.     cetirizine (ZYRTEC) 10 MG tablet Take 10 mg by mouth at bedtime.     Cholecalciferol (VITAMIN D3) 50 MCG (2000 UT) CAPS Take 2,000 Units by mouth daily.     fexofenadine (ALLEGRA) 180 MG tablet Take 180 mg by mouth as needed. Alternating with cetirizine     fluticasone (FLONASE) 50 MCG/ACT nasal spray Place 2 sprays into both nostrils daily.     furosemide (LASIX) 20 MG  tablet Take 1 tablet by mouth as needed.     Magnesium Oxide -Mg Supplement 500 MG TABS Take 2 tablets by mouth nightly Oral     meloxicam (MOBIC) 15 MG tablet Take 15 mg by mouth daily. (Patient not taking: Reported on 07/05/2024)     metFORMIN (GLUCOPHAGE) 1000 MG tablet Take 1,000 mg by mouth 2 (two) times daily.     pantoprazole  (PROTONIX ) 20 MG tablet 1 tablet 1/2 to 1 hour before morning meal Orally Once a day     propranolol (INDERAL) 20 MG tablet Take 20 mg by mouth daily.     No current facility-administered medications for this visit.     ALLERGIES: Codeine and Sulfa antibiotics  Family History  Problem Relation Age of Onset   Breast cancer Mother    Lung cancer Mother    Alcoholism Mother    Arthritis Mother    Bone cancer Father    Brain cancer Father    Hypertension Father    Lung cancer Father    Throat cancer Brother    Lung cancer Brother    Other Brother        Neuroendocrine tumors   Bone cancer Brother    Hodgkin's lymphoma Brother    Diabetes Maternal Grandfather    Lung cancer Paternal Grandmother    Stomach cancer Paternal Grandfather    Rectal cancer Neg Hx    Colon cancer Neg  Hx     Social History   Socioeconomic History   Marital status: Married    Spouse name: Not on file   Number of children: 0   Years of education: Not on file   Highest education level: Not on file  Occupational History   Occupation: Retired [employer]  Tobacco Use   Smoking status: Never   Smokeless tobacco: Never  Vaping Use   Vaping status: Never Used  Substance and Sexual Activity   Alcohol use: Not Currently   Drug use: Never   Sexual activity: Not Currently    Comment: less than 5, IC after 14, no STD, no abnormal, DES exposure  Other Topics Concern   Not on file  Social History Narrative   2 CHILDREN: Both adopted, patient stated that she was unable to have children.   Social Drivers of Health   Tobacco Use: Low Risk (07/05/2024)   Patient History    Smoking Tobacco Use: Never    Smokeless Tobacco Use: Never    Passive Exposure: Not on file  Financial Resource Strain: Low Risk (11/20/2023)   Received from [facility]   Overall Financial Resource Strain (CARDIA)    Difficulty of Paying Living Expenses: Not hard at all  Food Insecurity: No Food Insecurity (11/20/2023)   [facility]   Epic    Within the past 12 months, you worried that your food would run out before you got the money to buy more.: Never true    Within the past 12 months, the food you bought just didn't last and you didn't have money to get more.: Never true  Transportation Needs: No Transportation Needs (11/20/2023)   Received from [facility] - Transportation    Lack of Transportation (Medical): No    Lack of Transportation (Non-Medical): No  Physical Activity: Not on file  Stress: Not on file  Social Connections: Unknown (11/27/2021)   Received from [facility]   Social Network    Social Network: Not on file  Intimate Partner Violence: Unknown (10/19/2021)  Received from [facility]   HITS    Physically Hurt: Not on file    Insult or Talk Down To: Not on file     Threaten Physical Harm: Not on file    Scream or Curse: Not on file  Depression (EYV7-0): Not on file  Alcohol Screen: Not on file  Housing: Low Risk (11/20/2023)   Received from [facility]    In the last 12 months, was there a time when you were not able to pay the mortgage or rent on time?: No    In the past 12 months, how many times have you moved where you were living?: 0    At any time in the past 12 months, were you homeless or living in a shelter (including now)?: No  Utilities: Not At Risk (11/20/2023)   Received from [facility] Utilities    Threatened with loss of utilities: No  Health Literacy: Not on file    Review of Systems  PHYSICAL EXAMINATION:   There were no vitals taken for this visit.    General appearance: alert, cooperative and appears stated age Head: Normocephalic, without obvious abnormality, atraumatic Neck: no adenopathy, supple, symmetrical, trachea midline and thyroid normal to inspection and palpation Lungs: clear to auscultation bilaterally Breasts: normal appearance, no masses or tenderness, No nipple retraction or dimpling, No nipple discharge or bleeding, No axillary or supraclavicular adenopathy Heart: regular rate and rhythm Abdomen: soft, non-tender, no masses,  no organomegaly Extremities: extremities normal, atraumatic, no cyanosis or edema Skin: Skin color, texture, turgor normal. No rashes or lesions Lymph nodes: Cervical, supraclavicular, and axillary nodes normal. No abnormal inguinal nodes palpated Neurologic: Grossly normal  Pelvic: External genitalia:  no lesions              Urethra:  normal appearing urethra with no masses, tenderness or lesions              Bartholins and Skenes: normal                 Vagina: normal appearing vagina with normal color and discharge, no lesions              Cervix: no lesions                Bimanual Exam:  Uterus:  normal size, contour, position, consistency, mobility, non-tender               Adnexa: no mass, fullness, tenderness              Rectal exam: {yes no:314532}.  Confirms.              Anus:  normal sphincter tone, no lesions  Chaperone was present for exam:  {BSCHAPERONE:31226::Emily F, CMA}  ASSESSMENT:    PLAN:    {LABS (Optional):23779}  ***  total time was spent for this patient encounter, including preparation, face-to-face counseling with the patient, coordination of care, and documentation of the encounter.    "

## 2024-07-19 ENCOUNTER — Ambulatory Visit: Admitting: Obstetrics and Gynecology

## 2024-07-22 ENCOUNTER — Ambulatory Visit
Admission: RE | Admit: 2024-07-22 | Discharge: 2024-07-22 | Disposition: A | Discharge status: Home / Self Care | Source: Ambulatory Visit | Attending: Internal Medicine | Admitting: Internal Medicine

## 2024-07-22 DIAGNOSIS — Z1231 Encounter for screening mammogram for malignant neoplasm of breast: Secondary | ICD-10-CM

## 2024-08-03 ENCOUNTER — Encounter: Payer: Self-pay | Admitting: Cardiology

## 2024-08-03 ENCOUNTER — Ambulatory Visit: Attending: Cardiology | Admitting: Cardiology

## 2024-08-03 VITALS — BP 109/67 | HR 82 | Resp 16 | Ht 65.0 in | Wt 204.3 lb

## 2024-08-03 DIAGNOSIS — E782 Mixed hyperlipidemia: Secondary | ICD-10-CM | POA: Insufficient documentation

## 2024-08-03 DIAGNOSIS — R002 Palpitations: Secondary | ICD-10-CM | POA: Insufficient documentation

## 2024-08-03 DIAGNOSIS — R7303 Prediabetes: Secondary | ICD-10-CM | POA: Diagnosis not present

## 2024-08-03 DIAGNOSIS — I959 Hypotension, unspecified: Secondary | ICD-10-CM | POA: Insufficient documentation

## 2024-08-03 DIAGNOSIS — E6609 Other obesity due to excess calories: Secondary | ICD-10-CM | POA: Diagnosis not present

## 2024-08-03 DIAGNOSIS — Z6834 Body mass index (BMI) 34.0-34.9, adult: Secondary | ICD-10-CM | POA: Diagnosis not present

## 2024-08-03 DIAGNOSIS — E66811 Obesity, class 1: Secondary | ICD-10-CM | POA: Diagnosis not present

## 2024-08-03 NOTE — Progress Notes (Signed)
 " Cardiology Office Note:  .   Date:  08/03/2024  ID:  [name], Lauren [birth year], MRN [id number] PCP:  Stephane Leita DEL, MD  Former Cardiology Providers: NA [facility] Providers Cardiologist:  Madonna Large, DO , [facility] (established care 12/31/2019) Electrophysiologist:  None  Click to update primary MD,subspecialty MD or APP then REFRESH:1}    Chief Complaint  Patient presents with   Palpitations   Follow-up    History of Present Illness: Lauren   Brena [name] is a 69 y.o. Caucasian female whose past medical history and cardiovascular risk factors includes: carcinoid tumor of small intestine (per patient), Prediabetes, mixed hyperlipidemia, obesity due to excess calories, postmenopausal female, advanced age.   Patient has been followed in the practice for evaluation and management of palpitations.  In the past she was started on Cardizem  and she did well.  However due to essential tremors PCP transitioned the patient back to propranolol.  Patient was last seen in the practice in October of 2024 at which time overall she was doing well from a cardiovascular standpoint and she was recommended to follow-up yearly or as needed.  She presents today for follow-up  Palpitations are well controlled on current dose of propranolol.   She has dizziness and lightheadedness mainly with position changes such as standing from a chair. She feels she might fall to either side and notes unsteady gait like I'm drunk. She has no true syncope but has near-syncope. She stopped meloxicam and now uses furosemide only as needed, which she thinks may relate to these symptoms.  She drinks 8 to 10 glasses of water daily, skips breakfast, eats a light lunch, and has a larger early dinner.  She has no chest pain or shortness of breath. She takes propranolol and wants to increase the dose due to her hand tremors.  She has prediabetes treated with metformin 1000 mg twice daily, with an A1c of 5.1% in  March last year.  She takes Lipitor 10 mg for cholesterol, with improved cholesterol levels.  Review of Systems: .   Review of Systems  Cardiovascular:  Positive for near-syncope and palpitations (stable). Negative for chest pain, claudication, dyspnea on exertion, irregular heartbeat, leg swelling, orthopnea, paroxysmal nocturnal dyspnea and syncope.  Respiratory:  Negative for shortness of breath.   Hematologic/Lymphatic: Negative for bleeding problem.  Musculoskeletal:  Negative for muscle cramps and myalgias.  Neurological:  Positive for dizziness and light-headedness.    Studies Reviewed:   EKG: EKG Interpretation Date/Time:  Tuesday August 03 2024 10:55:27 EST Ventricular Rate:  79 PR Interval:  138 QRS Duration:  106 QT Interval:  374 QTC Calculation: 428 R Axis:   104  Text Interpretation: Normal sinus rhythm Rightward axis When compared with ECG of 29-Apr-2023 11:06, Inverted T waves have replaced nonspecific T wave abnormality in Inferior leads Confirmed by Large Madonna [id number]) on 08/03/2024 11:28:13 AM  Echocardiogram: 01/06/2020:  Normal LV systolic function with visual EF 55-60%. Left ventricle cavity is normal in size. Mild left ventricular hypertrophy. Normal global wall motion. Indeterminate diastolic filling pattern, normal LAP.  Left atrial cavity is mildly dilated.  Mild (Grade I) mitral regurgitation.  IVC is dilated with a respiratory response of <50%.  No prior study for comparison.    Stress Testing: Exercise Myoview stress test 01/24/2020:  Exercise nuclear stress test was performed using Bruce protocol. Patient reached 7 METS, and 96% of age predicted maximum heart rate. Exercise capacity was low. Chest pain not reported. Heart  rate and hemodynamic response were normal. Stress EKG revealed no ischemic changes.  Normal myocardial perfusion. Stress LVEF 67%. Low risk study,   Coronary artery calcification scoring performed on 01/05/2020 at [facility]:   Total calcium  score: 0 AU  Incidental finding of a 29 mm left lobe liver cyst.    Lower extremity ABI September 2024: Right: Resting right ankle-brachial index is within normal range. The right toe-brachial index is normal.   Left: Resting left ankle-brachial index is within normal range. The left toe-brachial index is normal.   RADIOLOGY: NA  Risk Assessment/Calculations:   NA   Labs:       Latest Ref Rng & Units 01/15/2024    3:55 PM  CBC  WBC 4.0 - 10.5 K/uL 8.0   Hemoglobin 12.0 - 15.0 g/dL 85.3   Hematocrit 63.9 - 46.0 % 43.4   Platelets 150.0 - 400.0 K/uL 342.0        Latest Ref Rng & Units 01/15/2024    3:55 PM 10/26/2020    9:02 AM 09/08/2020    9:20 AM  BMP  Glucose 70 - 99 mg/dL 898  881  885   BUN 6 - 23 mg/dL 27  10  13    Creatinine 0.40 - 1.20 mg/dL 9.13  9.22  9.09   BUN/Creat Ratio 12 - 28  13  14    Sodium 135 - 145 mEq/L 138  143  142   Potassium 3.5 - 5.1 mEq/L 4.0  4.8  4.8   Chloride 96 - 112 mEq/L 97  104  103   CO2 19 - 32 mEq/L 33  23    Calcium  8.4 - 10.5 mg/dL 9.7  9.2  9.7       Latest Ref Rng & Units 01/15/2024    3:55 PM 10/26/2020    9:02 AM 09/08/2020    9:20 AM  CMP  Glucose 70 - 99 mg/dL 898  881  885   BUN 6 - 23 mg/dL 27  10  13    Creatinine 0.40 - 1.20 mg/dL 9.13  9.22  9.09   Sodium 135 - 145 mEq/L 138  143  142   Potassium 3.5 - 5.1 mEq/L 4.0  4.8  4.8   Chloride 96 - 112 mEq/L 97  104  103   CO2 19 - 32 mEq/L 33  23    Calcium  8.4 - 10.5 mg/dL 9.7  9.2  9.7   Total Protein 6.0 - 8.3 g/dL 7.4  6.0  6.7   Total Bilirubin 0.2 - 1.2 mg/dL 0.6  0.2  0.3   Alkaline Phos 39 - 117 U/L 64  86  97   AST 0 - 37 U/L 17  13  17    ALT 0 - 35 U/L 17  16      Lab Results  Component Value Date   CHOL 160 10/26/2020   HDL 62 10/26/2020   LDLCALC 80 10/26/2020   LDLDIRECT 71 10/26/2020   TRIG 100 10/26/2020   No results for input(s): LIPOA in the last 8760 hours. No components found for: NTPROBNP No results for input(s): PROBNP  in the last 8760 hours. No results for input(s): TSH in the last 8760 hours.  External Labs: Collected: 05/09/2022 provided by primary care physician. Sodium 140, potassium 4.6, chloride 102, bicarb 27,  BUN 16, creatinine 0.7. AST 16, ALT 16, alkaline phosphatase 70 Hemoglobin 13.7, hematocrit 36.6% Total cholesterol 161, triglycerides 100, HDL 54, LDL 87, non-HDL 107 TSH 3.13. A1c  5.6  External Labs: Collected: September 24, 2023 [facility] database. Total cholesterol 198, HDL 62, LDL 108, triglycerides 138. A1c 5.1.   Physical Exam:    Today's Vitals   08/03/24 1051  BP: 109/67  Pulse: 82  Resp: 16  SpO2: 97%  Weight: 204 lb 4.8 oz (92.7 kg)  Height: 5' 5 (1.651 m)   Body mass index is 34 kg/m. Wt Readings from Last 3 Encounters:  08/03/24 204 lb 4.8 oz (92.7 kg)  07/05/24 202 lb (91.6 kg)  02/20/24 197 lb (89.4 kg)    Physical Exam  Constitutional: No distress.  Age appropriate, hemodynamically stable.   Neck: No JVD present.  Cardiovascular: Normal rate, regular rhythm, S1 normal, S2 normal, intact distal pulses and normal pulses. Exam reveals no gallop, no S3 and no S4.  No murmur heard. Pulses:      Dorsalis pedis pulses are 2+ on the right side and 2+ on the left side.       Posterior tibial pulses are 2+ on the right side and 2+ on the left side.  Pulmonary/Chest: Effort normal and breath sounds normal. No stridor. She has no wheezes. She has no rales.  Abdominal: Soft. Bowel sounds are normal. She exhibits no distension. There is no abdominal tenderness.  Musculoskeletal:        General: No edema.     Cervical back: Neck supple.  Neurological: She is alert and oriented to person, place, and time. She has intact cranial nerves (2-12).  Skin: Skin is warm and moist.   Impression & Recommendation(s):  Impression:   ICD-10-CM   1. Hypotension, unspecified hypotension type  I95.9     2. Palpitations  R00.2 EKG 12-Lead    3. Prediabetes  R73.03     4. Mixed  hyperlipidemia  E78.2     5. Class 1 obesity due to excess calories without serious comorbidity with body mass index (BMI) of 34.0 to 34.9 in adult  E66.811    E66.09    Z68.34        Recommendation(s):  Hypotension, unspecified hypotension type Likely due to propranolol and dietary habits. Reports dizziness and near syncope with position changes. Low blood pressure noted. -Blood pressures are better on recheck. - Increase fluid intake by 2-3 glasses daily. -Start consuming 3 heart healthy meals per day - Substitute regular crackers with saltine crackers. - Do not increase propranolol dosage until soft BP is better controlled. - Monitor blood sugar during lightheadedness. - Consult primary care provider about diabetes management  Palpitations Well-controlled on current dose of propranolol.  Mixed hyperlipidemia Remains on Lipitor 10 mg p.o. daily, patient requesting refills. She will have labs with PCP in March 2026 and will send us  a copy for reference.  Class 1 obesity due to excess calories without serious comorbidity with body mass index (BMI) of 34.0 to 34.9 in adult Body mass index is 34 kg/m. I reviewed with her importance of diet, regular physical activity/exercise, weight loss.   Patient is educated on the importance of increasing physical activity gradually as tolerated with a goal of moderate intensity exercise for 30 minutes a day 5 days a week.  Medical Decision Making:  Discussed management of at least 2 chronic comorbid conditions/symptoms. EKG independently ordered and reviewed. Prescription drug management. Independently reviewed labs from March 2025, [facility] database. This note will be shared with the patient's primary care provider to coordinate care.   Patient will continue longitudinal follow-up with primary care provider for management of  comorbid conditions.   Orders Placed:  Orders Placed This Encounter  Procedures   EKG 12-Lead    Final Medication  List:   No orders of the defined types were placed in this encounter.   Medications Discontinued During This Encounter  Medication Reason   meloxicam (MOBIC) 15 MG tablet Patient Preference     Current Outpatient Medications:    alendronate (FOSAMAX) 70 MG tablet, 1 tablet 30 minutes before the first food, beverage or medicine of the day with plain water Orally once a week, Disp: , Rfl:    amitriptyline (ELAVIL) 25 MG tablet, Take 25 mg by mouth at bedtime., Disp: , Rfl:    amoxicillin (AMOXIL) 500 MG capsule, Take 1,000 mg by mouth 2 (two) times daily. Pre-Dental, Disp: , Rfl:    atorvastatin  (LIPITOR) 10 MG tablet, TAKE 1 TABLET BY MOUTH EVERY NIGHT AT BEDTIME, Disp: 15 tablet, Rfl: 0   b complex vitamins capsule, Take 1 capsule by mouth daily., Disp: , Rfl:    buPROPion (WELLBUTRIN XL) 300 MG 24 hr tablet, Take 300 mg by mouth daily., Disp: , Rfl:    cetirizine (ZYRTEC) 10 MG tablet, Take 10 mg by mouth at bedtime., Disp: , Rfl:    Cholecalciferol (VITAMIN D3) 50 MCG (2000 UT) CAPS, Take 2,000 Units by mouth daily., Disp: , Rfl:    fexofenadine (ALLEGRA) 180 MG tablet, Take 180 mg by mouth as needed. Alternating with cetirizine, Disp: , Rfl:    fluticasone (FLONASE) 50 MCG/ACT nasal spray, Place 2 sprays into both nostrils daily., Disp: , Rfl:    furosemide (LASIX) 20 MG tablet, Take 1 tablet by mouth as needed., Disp: , Rfl:    Magnesium Oxide -Mg Supplement 500 MG TABS, Take 2 tablets by mouth nightly Oral, Disp: , Rfl:    metFORMIN (GLUCOPHAGE) 1000 MG tablet, Take 1,000 mg by mouth 2 (two) times daily., Disp: , Rfl:    pantoprazole  (PROTONIX ) 20 MG tablet, 1 tablet 1/2 to 1 hour before morning meal Orally Once a day, Disp: , Rfl:    propranolol (INDERAL) 20 MG tablet, Take 20 mg by mouth daily., Disp: , Rfl:   Consent:   NA  Disposition:   6  month follow up sooner if needed.   Her questions and concerns were addressed to her satisfaction. She voices understanding of the  recommendations provided during this encounter.    Signed, Madonna Michele HAS, [facility]  A Division of [facility] [address], [place], [place] [address]   08/03/2024 2:06 PM "

## 2024-08-03 NOTE — Patient Instructions (Signed)
 Medication Instructions:  Your physician recommends that you continue on your current medications as directed. Please refer to the Current Medication list given to you today.  *If you need a refill on your cardiac medications before your next appointment, please call your pharmacy*  Lab Work: None ordered If you have labs (blood work) drawn today and your tests are completely normal, you will receive your results only by: MyChart Message (if you have MyChart) OR A paper copy in the mail If you have any lab test that is abnormal or we need to change your treatment, we will call you to review the results.  Testing/Procedures: None ordered  Follow-Up: At [facility], you and your health needs are our priority.  As part of our continuing mission to provide you with exceptional heart care, our providers are all part of one team.  This team includes your primary Cardiologist (physician) and Advanced Practice Providers or APPs (Physician Assistants and Nurse Practitioners) who all work together to provide you with the care you need, when you need it.  Your next appointment:   6 month(s)  Provider:   Madonna Large, DO    We recommend signing up for the patient portal called MyChart.  Sign up information is provided on this After Visit Summary.  MyChart is used to connect with patients for Virtual Visits (Telemedicine).  Patients are able to view lab/test results, encounter notes, upcoming appointments, etc.  Non-urgent messages can be sent to your provider as well.   To learn more about what you can do with MyChart, go to [email or web address].

## 2024-08-06 ENCOUNTER — Other Ambulatory Visit: Payer: Self-pay | Admitting: Cardiology

## 2024-08-06 DIAGNOSIS — E7800 Pure hypercholesterolemia, unspecified: Secondary | ICD-10-CM

## 2024-08-09 ENCOUNTER — Emergency Department (HOSPITAL_BASED_OUTPATIENT_CLINIC_OR_DEPARTMENT_OTHER)
Admission: EM | Admit: 2024-08-09 | Discharge: 2024-08-09 | Disposition: A | Discharge status: Home / Self Care | Attending: Emergency Medicine | Admitting: Emergency Medicine

## 2024-08-09 ENCOUNTER — Other Ambulatory Visit: Payer: Self-pay

## 2024-08-09 ENCOUNTER — Encounter (HOSPITAL_BASED_OUTPATIENT_CLINIC_OR_DEPARTMENT_OTHER): Payer: Self-pay

## 2024-08-09 ENCOUNTER — Emergency Department (HOSPITAL_BASED_OUTPATIENT_CLINIC_OR_DEPARTMENT_OTHER)

## 2024-08-09 DIAGNOSIS — R531 Weakness: Secondary | ICD-10-CM | POA: Insufficient documentation

## 2024-08-09 DIAGNOSIS — I959 Hypotension, unspecified: Secondary | ICD-10-CM | POA: Insufficient documentation

## 2024-08-09 DIAGNOSIS — R55 Syncope and collapse: Secondary | ICD-10-CM | POA: Insufficient documentation

## 2024-08-09 DIAGNOSIS — R609 Edema, unspecified: Secondary | ICD-10-CM | POA: Insufficient documentation

## 2024-08-09 DIAGNOSIS — Z043 Encounter for examination and observation following other accident: Secondary | ICD-10-CM | POA: Insufficient documentation

## 2024-08-09 DIAGNOSIS — R079 Chest pain, unspecified: Secondary | ICD-10-CM | POA: Insufficient documentation

## 2024-08-09 DIAGNOSIS — W19XXXA Unspecified fall, initial encounter: Secondary | ICD-10-CM

## 2024-08-09 DIAGNOSIS — R253 Fasciculation: Secondary | ICD-10-CM | POA: Insufficient documentation

## 2024-08-09 DIAGNOSIS — R946 Abnormal results of thyroid function studies: Secondary | ICD-10-CM | POA: Insufficient documentation

## 2024-08-09 DIAGNOSIS — W06XXXA Fall from bed, initial encounter: Secondary | ICD-10-CM | POA: Insufficient documentation

## 2024-08-09 LAB — COMPREHENSIVE METABOLIC PANEL WITH GFR
ALT: 16 U/L (ref 0–44)
AST: 23 U/L (ref 15–41)
Albumin: 4.2 g/dL (ref 3.5–5.0)
Alkaline Phosphatase: 83 U/L (ref 38–126)
Anion gap: 11 (ref 5–15)
BUN: 14 mg/dL (ref 8–23)
CO2: 27 mmol/L (ref 22–32)
Calcium: 9.7 mg/dL (ref 8.9–10.3)
Chloride: 99 mmol/L (ref 98–111)
Creatinine, Ser: 0.76 mg/dL (ref 0.44–1.00)
GFR, Estimated: >60 mL/min
Glucose, Bld: 95 mg/dL (ref 70–99)
Potassium: 4.1 mmol/L (ref 3.5–5.1)
Sodium: 137 mmol/L (ref 135–145)
Total Bilirubin: 0.3 mg/dL (ref 0.0–1.2)
Total Protein: 6.4 g/dL — ABNORMAL LOW (ref 6.5–8.1)

## 2024-08-09 LAB — URINALYSIS, ROUTINE W REFLEX MICROSCOPIC
Bacteria, UA: NONE SEEN
Bilirubin Urine: NEGATIVE
Glucose, UA: NEGATIVE mg/dL
Hgb urine dipstick: NEGATIVE
Ketones, ur: NEGATIVE mg/dL
Nitrite: NEGATIVE
Protein, ur: NEGATIVE mg/dL
Specific Gravity, Urine: <1.005 — ABNORMAL LOW (ref 1.005–1.030)
pH: 7 (ref 5.0–8.0)

## 2024-08-09 LAB — CBC WITH DIFFERENTIAL/PLATELET
Abs Immature Granulocytes: 0.01 10*3/uL (ref 0.00–0.07)
Basophils Absolute: 0.1 10*3/uL (ref 0.0–0.1)
Basophils Relative: 1 %
Eosinophils Absolute: 0.2 10*3/uL (ref 0.0–0.5)
Eosinophils Relative: 3 %
HCT: 37.6 % (ref 36.0–46.0)
Hemoglobin: 12.7 g/dL (ref 12.0–15.0)
Immature Granulocytes: 0 %
Lymphocytes Relative: 27 %
Lymphs Abs: 1.9 10*3/uL (ref 0.7–4.0)
MCH: 30.2 pg (ref 26.0–34.0)
MCHC: 33.8 g/dL (ref 30.0–36.0)
MCV: 89.3 fL (ref 80.0–100.0)
Monocytes Absolute: 0.6 10*3/uL (ref 0.1–1.0)
Monocytes Relative: 8 %
Neutro Abs: 4.3 10*3/uL (ref 1.7–7.7)
Neutrophils Relative %: 61 %
Platelets: 283 10*3/uL (ref 150–400)
RBC: 4.21 MIL/uL (ref 3.87–5.11)
RDW: 12.6 % (ref 11.5–15.5)
WBC: 7.1 10*3/uL (ref 4.0–10.5)
nRBC: 0 % (ref 0.0–0.2)

## 2024-08-09 LAB — CK: Total CK: 46 U/L (ref 38–234)

## 2024-08-09 LAB — TSH: TSH: 4.58 u[IU]/mL — ABNORMAL HIGH (ref 0.350–4.500)

## 2024-08-09 LAB — TROPONIN T, HIGH SENSITIVITY
Troponin T High Sensitivity: <6 ng/L (ref 0–19)
Troponin T High Sensitivity: <6 ng/L (ref 0–19)

## 2024-08-09 LAB — PRO BRAIN NATRIURETIC PEPTIDE: Pro Brain Natriuretic Peptide: <50 pg/mL

## 2024-08-09 LAB — LACTIC ACID, PLASMA: Lactic Acid, Venous: 1.6 mmol/L (ref 0.5–1.9)

## 2024-08-09 LAB — MAGNESIUM: Magnesium: 2.3 mg/dL (ref 1.7–2.4)

## 2024-08-09 MED ORDER — SODIUM CHLORIDE 0.9 % IV BOLUS
500.0000 mL | Freq: Once | INTRAVENOUS | Status: AC
  Administered 2024-08-09: 500 mL via INTRAVENOUS

## 2024-08-09 MED ORDER — CEFDINIR 300 MG PO CAPS: 300.0000 mg | ORAL_CAPSULE | Freq: Two times a day (BID) | ORAL | 0 refills | Status: AC

## 2024-08-09 NOTE — ED Notes (Signed)
 Reviewed AVS/discharge instructions with patient. Time allotted for and all questions answered. Patient is agreeable for d/c and escorted to ED exit by staff.

## 2024-08-09 NOTE — Discharge Instructions (Addendum)
 You were seen in the emergency department after a fall and for weakness and twitching.  Your evaluation is reassuring and your symptoms seem to be related to low blood pressure.  Your head CT does not show any acute process.  Your labs are reassuring.  Your urinalysis does show some signs of infection so I will treat you for this.  You should increase your fluid and salt intake.  Check your blood pressure daily and record it at home to discuss with your doctor.  As we discussed, you may have to reduce your propranolol dose if your blood pressure is persistently low.

## 2024-08-09 NOTE — ED Notes (Signed)
 Patient notified of need for urine sample to complete eval/assessment. Specimen cup provided and instructions for clean catch given. Patient will notify staff when able to provide

## 2024-08-09 NOTE — ED Notes (Signed)
 Ambulated with patient approx 100 feet. Pt said she felt better but still had a couple tremor feelings in her legs. Denies any weakness or feeling faint.

## 2024-08-09 NOTE — ED Triage Notes (Signed)
 Arrives POV with complaints of waking up this morning and falling due to her both of her legs feeling weak with jerking all over . No head injury or LOC.

## 2024-08-09 NOTE — ED Provider Notes (Signed)
 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 Got out of bed at 0400 and legs felt weak and gave out on her. She fell to ground but did not hit head or lose consciousness. Landed on bottom and slid down from bed to ground. No pain or injury. But then felt very weak and could not get up. BP has been low and seen on 1/20 by cardiology for this who noted increase water and salt intake.    Allergies: Cephalexin, Penicillins, and Sulfa antibiotics    Review of Systems  Positive for leg weakness and twitching  Updated Vital Signs BP (!) 139/101   Pulse 97   Temp 99.3 F (37.4 C) (Oral)   Resp 18   LMP 08/01/2024   SpO2 99%   Physical Exam  Constitutional: Patient appears well-developed and well-nourished. No distress.  HENT:  Head: Normocephalic and atraumatic.  Mouth/Throat: Oropharynx is clear and moist. No oropharyngeal exudate.  Eyes: Conjunctivae are normal. Pupils are equal, round, and reactive to light. Neck: Normal range of motion. Neck supple.  Cardiovascular: Normal rate, regular rhythm, normal heart sounds and intact distal pulses.   Pulmonary/Chest: Effort normal and breath sounds normal. No respiratory distress. No wheezes or rales.  Abdominal: Soft. Bowel sounds are normal. No distension or tenderness.  Musculoskeletal: Normal range of motion. No edema or tenderness.  Neurological: Patient is alert and oriented.  No facial droop.  Clear speech.  Normal strength and sensation throughout.  Normal coordination. Skin: Skin is warm and dry. No diaphoresis.  Psychiatric: Normal mood and affect. Normal behavior. Judgment and thought content normal.  Nursing note and vitals reviewed.    Labs Reviewed   COMPREHENSIVE METABOLIC PANEL WITH GFR - Abnormal; Notable for the following components:      Result Value   Total Protein 6.4 (*)    All other components within normal limits  URINALYSIS, ROUTINE W REFLEX MICROSCOPIC - Abnormal; Notable for the following components:   Specific Gravity, Urine <1.005 (*)    Leukocytes,Ua LARGE (*)    All other components within normal limits  TSH - Abnormal; Notable for the following components:   TSH 4.580 (*)    All other components within normal limits  PRO BRAIN NATRIURETIC PEPTIDE  CBC WITH DIFFERENTIAL/PLATELET  MAGNESIUM  LACTIC ACID, PLASMA  CK  TROPONIN T, HIGH SENSITIVITY  TROPONIN T, HIGH SENSITIVITY     CT Head Wo Contrast  Final Result      EKG normal sinus rhythm at 75 bpm, normal axis, normal intervals, no acute ischemia.   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 The complaint involves an extensive  differential diagnosis and also carries with it a high risk of complications and morbidity.    Additional history obtained from spouse. I have also reviewed previous outpatient records  The initial management included labs, IV fluids, EKG, head CT    Reassessments:  The following labs were independently interpreted: Basic labs are reassuring.  TSH slightly elevated.  Urine had some signs of infection.  No evidence of cardiac ischemia or sepsis.  I independently visualized the following imaging with scope of interpretation limited to determining acute life threatening conditions  related to emergency care: Head CT, which revealed no acute process  Treatment and Reassessment: Patient given 2 boluses of saline and feels much better.  Blood pressure has come up from 90s to 120s.  She is ambulatory neurologically intact.  No muscle twitching noted.  Labs are generally reassuring.  I suspect her symptoms today are related to a low blood pressure which was documented by cardiology in recent visit.  Advised to increase her fluid intake and salt intake as per their recommendations.  Will not adjust propranolol though she was advised that she might need to have her blood pressure continues to be low.    Despite hypotension I find no evidence of dehydration, cardiac ischemia, sepsis, other critical diagnoses.  Since symptoms have resolved after fluids and blood pressure improving can discharge.    Will start antibiotics for UTI.  No previous culture data.  All results reviewed with patient.  All questions answered.  Careful return precautions have been given.  Close follow-up advised. Patient is happy with plan and requests discharge.   Consultation: - Consulted or discussed management/test interpretation with external professional: None  Consideration for admission or further workup:  Social Determinants of health: None  ED Discharge Orders          Ordered    cefdinir  (OMNICEF ) 300 MG capsule  2 times daily        08/09/24 1512                  Joliyah Lippens Hima, MD 08/10/24 1034  "

## 2024-08-09 NOTE — ED Notes (Addendum)
 Pt aware of urine sample still needed

## 2024-08-10 ENCOUNTER — Ambulatory Visit: Admitting: Obstetrics and Gynecology

## 2024-08-11 ENCOUNTER — Ambulatory Visit: Admitting: Obstetrics and Gynecology

## 2024-08-11 ENCOUNTER — Other Ambulatory Visit (HOSPITAL_COMMUNITY)
Admission: RE | Admit: 2024-08-11 | Discharge: 2024-08-11 | Disposition: A | Source: Ambulatory Visit | Attending: Obstetrics and Gynecology | Admitting: Obstetrics and Gynecology

## 2024-08-11 ENCOUNTER — Encounter: Payer: Self-pay | Admitting: Obstetrics and Gynecology

## 2024-08-11 VITALS — BP 120/76 | HR 101

## 2024-08-11 DIAGNOSIS — Z91B Personal risk factor of exposure to diethylstilbestrol: Secondary | ICD-10-CM | POA: Insufficient documentation

## 2024-08-11 DIAGNOSIS — Z1272 Encounter for screening for malignant neoplasm of vagina: Secondary | ICD-10-CM | POA: Insufficient documentation

## 2024-08-11 NOTE — Progress Notes (Signed)
 "  GYNECOLOGY  VISIT   HPI: 69 y.o.   Married  Caucasian female   G1P0010 with No LMP recorded. Patient has had a hysterectomy.   here for: Repeat pap    Pap 07/05/24 showed scant cellularity. Non dx.  Hx DES exposure.      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   Followed by Dr. Cloretta at [facility] for metastatic neuroendocrine tumor.    GYNECOLOGIC HISTORY: No LMP recorded. Patient has had a hysterectomy. Contraception:  Hyst  Menopausal hormone therapy:  n/a Last 2 paps:  07/05/24 non diagnostic, 07/02/23 neg HR HPV neg   History of abnormal Pap or positive HPV:  no Mammogram:  07/22/24 Breast Density Cat A, BIRADS Cat 1 neg         OB History     Gravida  1   Para      Term      Preterm      AB  1   Living         SAB      IAB      Ectopic      Multiple      Live Births           Obstetric Comments  2 CHILDREN: Both adopted, patient stated that she was unable to have children.            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   Cataract 2023   Depression    DES exposure in utero    Diabetes mellitus without complication (HCC)    Diverticulosis of colon    Dysphagia    Endometriosis    Essential tremor    GERD (gastroesophageal reflux disease)    Hx of adenomatous polyp of colon 05/2017   Hyperlipidemia    Hypertension    Neuroendocrine carcinoma of appendix (HCC)    Obesity    Osteopenia    Osteoporosis    Prediabetes     Past Surgical History:  Procedure Laterality Date   APPENDECTOMY  11/2005   BUNIONECTOMY     CATARACT EXTRACTION Right 02/06/2022   CATARACT EXTRACTION Left 02/20/2022   OOPHORECTOMY     TOTAL  ABDOMINAL HYSTERECTOMY  2007   TOTAL KNEE ARTHROPLASTY Left 11/13/2021    Current Outpatient Medications  Medication Sig Dispense Refill   alendronate (FOSAMAX) 70 MG tablet 1 tablet 30 minutes before the first food, beverage or medicine of the day with plain water Orally once a week     amitriptyline (ELAVIL) 25 MG tablet Take 25 mg by mouth at bedtime.     amoxicillin (AMOXIL) 500 MG capsule Take 1,000 mg by mouth 2 (two) times daily. Pre-Dental     atorvastatin  (LIPITOR) 10 MG tablet TAKE 1 TABLET BY MOUTH EVERY NIGHT AT BEDTIME 15 tablet 0   b complex vitamins capsule Take 1 capsule by mouth daily.     buPROPion (WELLBUTRIN XL) 300 MG 24 hr tablet Take 300 mg by mouth daily.     cefdinir  (OMNICEF ) 300 MG capsule Take 1 capsule (300 mg total) by mouth 2 (two) times daily. 14 capsule 0   cetirizine (ZYRTEC) 10 MG tablet Take  10 mg by mouth at bedtime.     Cholecalciferol (VITAMIN D3) 50 MCG (2000 UT) CAPS Take 2,000 Units by mouth daily.     fexofenadine (ALLEGRA) 180 MG tablet Take 180 mg by mouth as needed. Alternating with cetirizine     fluticasone (FLONASE) 50 MCG/ACT nasal spray Place 2 sprays into both nostrils daily.     furosemide (LASIX) 20 MG tablet Take 1 tablet by mouth as needed.     Magnesium Oxide -Mg Supplement 500 MG TABS Take 2 tablets by mouth nightly Oral     metFORMIN (GLUCOPHAGE) 1000 MG tablet Take 1,000 mg by mouth 2 (two) times daily.     pantoprazole  (PROTONIX ) 20 MG tablet 1 tablet 1/2 to 1 hour before morning meal Orally Once a day     propranolol (INDERAL) 20 MG tablet Take 20 mg by mouth daily.     No current facility-administered medications for this visit.     ALLERGIES: Codeine and Sulfa antibiotics  Family History  Problem Relation Age of Onset   Breast cancer Mother    Lung cancer Mother    Alcoholism Mother    Arthritis Mother    Bone cancer Father    Brain cancer Father    Hypertension Father    Lung cancer Father    Throat cancer Brother     Lung cancer Brother    Other Brother        Neuroendocrine tumors   Bone cancer Brother    Hodgkin's lymphoma Brother    Diabetes Maternal Grandfather    Lung cancer Paternal Grandmother    Stomach cancer Paternal Grandfather    Rectal cancer Neg Hx    Colon cancer Neg Hx     Social History   Socioeconomic History   Marital status: Married    Spouse name: Not on file   Number of children: 0   Years of education: Not on file   Highest education level: Not on file  Occupational History   Occupation: Retired [employer]  Tobacco Use   Smoking status: Never   Smokeless tobacco: Never  Vaping Use   Vaping status: Never Used  Substance and Sexual Activity   Alcohol use: Not Currently   Drug use: Never   Sexual activity: Not Currently    Comment: less than 5, IC after 14, no STD, no abnormal, DES exposure  Other Topics Concern   Not on file  Social History Narrative   2 CHILDREN: Both adopted, patient stated that she was unable to have children.   Social Drivers of Health   Tobacco Use: Low Risk (08/11/2024)   Patient History    Smoking Tobacco Use: Never    Smokeless Tobacco Use: Never    Passive Exposure: Not on file  Financial Resource Strain: Low Risk (11/20/2023)   Received from [facility]   Overall Financial Resource Strain (CARDIA)    Difficulty of Paying Living Expenses: Not hard at all  Food Insecurity: No Food Insecurity (11/20/2023)   Received from [facility]   Epic    Within the past 12 months, you worried that your food would run out before you got the money to buy more.: Never true    Within the past 12 months, the food you bought just didn't last and you didn't have money to get more.: Never true  Transportation Needs: No Transportation Needs (11/20/2023)   Received from [facility] - Transportation    Lack of Transportation (Medical): No  Lack of Transportation (Non-Medical): No  Physical Activity: Not on file  Stress: Not on file  Social  Connections: Unknown (11/27/2021)   Received from [facility]   Social Network    Social Network: Not on file  Intimate Partner Violence: Unknown (10/19/2021)   Received from [facility]   HITS    Physically Hurt: Not on file    Insult or Talk Down To: Not on file    Threaten Physical Harm: Not on file    Scream or Curse: Not on file  Depression (EYV7-0): Not on file  Alcohol Screen: Not on file  Housing: Low Risk (11/20/2023)   Received from [facility]    In the last 12 months, was there a time when you were not able to pay the mortgage or rent on time?: No    In the past 12 months, how many times have you moved where you were living?: 0    At any time in the past 12 months, were you homeless or living in a shelter (including now)?: No  Utilities: Not At Risk (11/20/2023)   Received from [facility] Utilities    Threatened with loss of utilities: No  Health Literacy: Not on file    Review of Systems  All other systems reviewed and are negative.   PHYSICAL EXAMINATION:   BP 120/76 (BP Location: Left Arm, Patient Position: Sitting)   Pulse (!) 101   SpO2 97%     General appearance: alert, cooperative and appears stated age  Pelvic: External genitalia:  no lesions              Urethra:  normal appearing urethra with no masses, tenderness or lesions              Bartholins and Skenes: normal                 Vagina: normal appearing vagina with normal color and discharge, no lesions.  Atrophy noted.              Cervix:  absent.                Bimanual Exam:  Uterus:  absent              Adnexa: no mass, fullness, tenderness     Chaperone was present for exam:  Jada M, CMA  ASSESSMENT:  Hx DES exposure.  Vaginal cancer screening.  Hx neuroendocrine tumor.   PLAN:  Pap and reflex HR HPV screening.  We reviewed literature for vaginal cancer screening in women post hysterectomy with normal pap hx and hx DES exposure.  Patient has option to discontinue  vaginal cancer screening in the future if this pap is normal.  Follow up yearly and prn.    20 min  total time was spent for this patient encounter, including preparation, face-to-face counseling with the patient, coordination of care, and documentation of the encounter.    "

## 2024-08-12 ENCOUNTER — Telehealth: Payer: Self-pay | Admitting: Cardiology

## 2024-08-12 DIAGNOSIS — E7800 Pure hypercholesterolemia, unspecified: Secondary | ICD-10-CM

## 2024-08-12 MED ORDER — ATORVASTATIN CALCIUM 10 MG PO TABS: 10.0000 mg | ORAL_TABLET | Freq: Every day | ORAL | 3 refills | Status: AC

## 2024-08-12 NOTE — Telephone Encounter (Signed)
 Refill sent

## 2024-08-12 NOTE — Telephone Encounter (Signed)
" °*  STAT* If patient is at the pharmacy, call can be transferred to refill team.   1. Which medications need to be refilled? (please list name of each medication and dose if known) atorvastatin  (LIPITOR) 10 MG tablet    2. Would you like to learn more about the convenience, safety, & potential cost savings by using the [facility]?    3. Are you open to using the [facility] (Type [facility].  ).   4. Which pharmacy/location (including street and city if local pharmacy) is medication to be sent to? [facility] [id number] - [place],  - [address]    5. Do they need a 30 day or 90 day supply? 90 day  "

## 2024-08-13 LAB — CYTOLOGY - PAP: Diagnosis: NEGATIVE

## 2024-08-16 ENCOUNTER — Ambulatory Visit: Payer: Self-pay | Admitting: Obstetrics and Gynecology

## 2024-08-19 ENCOUNTER — Emergency Department (HOSPITAL_COMMUNITY)

## 2024-08-19 ENCOUNTER — Inpatient Hospital Stay (HOSPITAL_COMMUNITY)

## 2024-08-19 ENCOUNTER — Inpatient Hospital Stay (HOSPITAL_COMMUNITY)
Admission: EM | Admit: 2024-08-19 | Source: Home / Self Care | Attending: Internal Medicine | Admitting: Internal Medicine

## 2024-08-19 ENCOUNTER — Other Ambulatory Visit: Payer: Self-pay

## 2024-08-19 ENCOUNTER — Encounter (HOSPITAL_COMMUNITY): Payer: Self-pay | Admitting: Internal Medicine

## 2024-08-19 DIAGNOSIS — E785 Hyperlipidemia, unspecified: Secondary | ICD-10-CM

## 2024-08-19 DIAGNOSIS — K219 Gastro-esophageal reflux disease without esophagitis: Secondary | ICD-10-CM

## 2024-08-19 DIAGNOSIS — S82841A Displaced bimalleolar fracture of right lower leg, initial encounter for closed fracture: Secondary | ICD-10-CM

## 2024-08-19 DIAGNOSIS — S82891A Other fracture of right lower leg, initial encounter for closed fracture: Secondary | ICD-10-CM | POA: Diagnosis present

## 2024-08-19 DIAGNOSIS — G253 Myoclonus: Secondary | ICD-10-CM

## 2024-08-19 LAB — CBC WITH DIFFERENTIAL/PLATELET
Abs Immature Granulocytes: 0.08 10*3/uL — ABNORMAL HIGH (ref 0.00–0.07)
Basophils Absolute: 0.1 10*3/uL (ref 0.0–0.1)
Basophils Relative: 1 %
Eosinophils Absolute: 0.2 10*3/uL (ref 0.0–0.5)
Eosinophils Relative: 2 %
HCT: 37.9 % (ref 36.0–46.0)
Hemoglobin: 12.1 g/dL (ref 12.0–15.0)
Immature Granulocytes: 1 %
Lymphocytes Relative: 22 %
Lymphs Abs: 2.4 10*3/uL (ref 0.7–4.0)
MCH: 29.6 pg (ref 26.0–34.0)
MCHC: 31.9 g/dL (ref 30.0–36.0)
MCV: 92.7 fL (ref 80.0–100.0)
Monocytes Absolute: 0.7 10*3/uL (ref 0.1–1.0)
Monocytes Relative: 6 %
Neutro Abs: 7.6 10*3/uL (ref 1.7–7.7)
Neutrophils Relative %: 68 %
Platelets: 281 10*3/uL (ref 150–400)
RBC: 4.09 MIL/uL (ref 3.87–5.11)
RDW: 13.2 % (ref 11.5–15.5)
WBC: 11 10*3/uL — ABNORMAL HIGH (ref 4.0–10.5)
nRBC: 0 % (ref 0.0–0.2)

## 2024-08-19 LAB — CBC
HCT: 38.8 % (ref 36.0–46.0)
Hemoglobin: 12.8 g/dL (ref 12.0–15.0)
MCH: 30.5 pg (ref 26.0–34.0)
MCHC: 33 g/dL (ref 30.0–36.0)
MCV: 92.6 fL (ref 80.0–100.0)
Platelets: 284 10*3/uL (ref 150–400)
RBC: 4.19 MIL/uL (ref 3.87–5.11)
RDW: 13.3 % (ref 11.5–15.5)
WBC: 9.3 10*3/uL (ref 4.0–10.5)
nRBC: 0 % (ref 0.0–0.2)

## 2024-08-19 LAB — CREATININE, SERUM
Creatinine, Ser: 0.74 mg/dL (ref 0.44–1.00)
GFR, Estimated: >60 mL/min

## 2024-08-19 LAB — BASIC METABOLIC PANEL WITH GFR
Anion gap: 9 (ref 5–15)
BUN: 14 mg/dL (ref 8–23)
CO2: 28 mmol/L (ref 22–32)
Calcium: 8.9 mg/dL (ref 8.9–10.3)
Chloride: 101 mmol/L (ref 98–111)
Creatinine, Ser: 0.75 mg/dL (ref 0.44–1.00)
GFR, Estimated: >60 mL/min
Glucose, Bld: 110 mg/dL — ABNORMAL HIGH (ref 70–99)
Potassium: 4 mmol/L (ref 3.5–5.1)
Sodium: 139 mmol/L (ref 135–145)

## 2024-08-19 LAB — HIV ANTIBODY (ROUTINE TESTING W REFLEX): HIV Screen 4th Generation wRfx: NONREACTIVE

## 2024-08-19 LAB — GLUCOSE, CAPILLARY: Glucose-Capillary: 125 mg/dL — ABNORMAL HIGH (ref 70–99)

## 2024-08-19 MED ORDER — ONDANSETRON HCL 4 MG PO TABS: 4.0000 mg | ORAL_TABLET | Freq: Four times a day (QID) | ORAL | Status: AC | PRN

## 2024-08-19 MED ORDER — ATORVASTATIN CALCIUM 10 MG PO TABS
10.0000 mg | ORAL_TABLET | Freq: Every day | ORAL | Status: DC
  Administered 2024-08-19: 10 mg via ORAL
  Filled 2024-08-19: qty 1

## 2024-08-19 MED ORDER — AMITRIPTYLINE HCL 25 MG PO TABS
25.0000 mg | ORAL_TABLET | Freq: Every day | ORAL | Status: AC
  Administered 2024-08-19: 25 mg via ORAL
  Filled 2024-08-19 (×2): qty 1

## 2024-08-19 MED ORDER — ACETAMINOPHEN 325 MG PO TABS
650.0000 mg | ORAL_TABLET | Freq: Four times a day (QID) | ORAL | Status: AC | PRN
  Administered 2024-08-20 (×2): 650 mg via ORAL
  Filled 2024-08-19 (×2): qty 2

## 2024-08-19 MED ORDER — PROPRANOLOL HCL 20 MG PO TABS
20.0000 mg | ORAL_TABLET | Freq: Every day | ORAL | Status: AC
  Administered 2024-08-19 – 2024-08-20 (×2): 20 mg via ORAL
  Filled 2024-08-19 (×2): qty 1

## 2024-08-19 MED ORDER — ACETAMINOPHEN 650 MG RE SUPP: 650.0000 mg | Freq: Four times a day (QID) | RECTAL | Status: AC | PRN

## 2024-08-19 MED ORDER — ALBUTEROL SULFATE (2.5 MG/3ML) 0.083% IN NEBU: 2.5000 mg | INHALATION_SOLUTION | RESPIRATORY_TRACT | Status: AC | PRN

## 2024-08-19 MED ORDER — POLYETHYLENE GLYCOL 3350 17 G PO PACK
17.0000 g | PACK | Freq: Every day | ORAL | Status: AC
  Administered 2024-08-19 – 2024-08-20 (×2): 17 g via ORAL
  Filled 2024-08-19 (×2): qty 1

## 2024-08-19 MED ORDER — ADULT MULTIVITAMIN W/MINERALS CH
1.0000 | ORAL_TABLET | Freq: Every day | ORAL | Status: AC
  Administered 2024-08-19 – 2024-08-20 (×2): 1 via ORAL
  Filled 2024-08-19 (×2): qty 1

## 2024-08-19 MED ORDER — FLEET ENEMA RE ENEM: 1.0000 | ENEMA | Freq: Once | RECTAL | Status: AC | PRN

## 2024-08-19 MED ORDER — LORATADINE 10 MG PO TABS
10.0000 mg | ORAL_TABLET | Freq: Every day | ORAL | Status: AC
  Administered 2024-08-19 – 2024-08-20 (×2): 10 mg via ORAL
  Filled 2024-08-19 (×2): qty 1

## 2024-08-19 MED ORDER — PANTOPRAZOLE SODIUM 40 MG PO TBEC
40.0000 mg | DELAYED_RELEASE_TABLET | Freq: Every day | ORAL | Status: AC
  Administered 2024-08-19 – 2024-08-20 (×2): 40 mg via ORAL
  Filled 2024-08-19 (×2): qty 1

## 2024-08-19 MED ORDER — BISACODYL 10 MG RE SUPP: 10.0000 mg | Freq: Every day | RECTAL | Status: AC | PRN

## 2024-08-19 MED ORDER — PROPOFOL 10 MG/ML IV BOLUS
0.5000 mg/kg | Freq: Once | INTRAVENOUS | Status: DC
  Administered 2024-08-19: 40 mg via INTRAVENOUS
  Filled 2024-08-19: qty 20

## 2024-08-19 MED ORDER — ENOXAPARIN SODIUM 40 MG/0.4ML IJ SOSY
40.0000 mg | PREFILLED_SYRINGE | INTRAMUSCULAR | Status: AC
  Administered 2024-08-19 – 2024-08-20 (×2): 40 mg via SUBCUTANEOUS
  Filled 2024-08-19 (×2): qty 0.4

## 2024-08-19 MED ORDER — ORAL CARE MOUTH RINSE: 15.0000 mL | OROMUCOSAL | Status: AC | PRN

## 2024-08-19 MED ORDER — VITAMIN D 25 MCG (1000 UNIT) PO TABS
2000.0000 [IU] | ORAL_TABLET | Freq: Every day | ORAL | Status: AC
  Administered 2024-08-19 – 2024-08-20 (×2): 2000 [IU] via ORAL
  Filled 2024-08-19 (×2): qty 2

## 2024-08-19 MED ORDER — BUPROPION HCL ER (XL) 150 MG PO TB24
300.0000 mg | ORAL_TABLET | Freq: Every day | ORAL | Status: AC
  Administered 2024-08-19 – 2024-08-20 (×2): 300 mg via ORAL
  Filled 2024-08-19 (×2): qty 2

## 2024-08-19 MED ORDER — OXYCODONE HCL 5 MG PO TABS
5.0000 mg | ORAL_TABLET | ORAL | Status: AC | PRN
  Administered 2024-08-19 – 2024-08-20 (×4): 5 mg via ORAL
  Filled 2024-08-19 (×6): qty 1

## 2024-08-19 MED ORDER — INSULIN ASPART 100 UNIT/ML IJ SOLN
0.0000 [IU] | Freq: Three times a day (TID) | INTRAMUSCULAR | Status: AC
  Administered 2024-08-20: 1 [IU] via SUBCUTANEOUS
  Filled 2024-08-19: qty 1

## 2024-08-19 MED ORDER — FLUTICASONE PROPIONATE 50 MCG/ACT NA SUSP
2.0000 | Freq: Every day | NASAL | Status: AC
  Filled 2024-08-19: qty 16

## 2024-08-19 MED ORDER — ONDANSETRON HCL 4 MG/2ML IJ SOLN: 4.0000 mg | Freq: Four times a day (QID) | INTRAMUSCULAR | Status: AC | PRN

## 2024-08-19 MED ORDER — NALOXONE HCL 0.4 MG/ML IJ SOLN: 0.4000 mg | INTRAMUSCULAR | Status: AC | PRN

## 2024-08-19 MED ORDER — HYDROMORPHONE HCL 1 MG/ML IJ SOLN: 0.5000 mg | INTRAMUSCULAR | Status: AC | PRN

## 2024-08-19 MED ORDER — GADOBUTROL 1 MMOL/ML IV SOLN
9.0000 mL | Freq: Once | INTRAVENOUS | Status: AC | PRN
  Administered 2024-08-19: 9 mL via INTRAVENOUS

## 2024-08-19 NOTE — ED Notes (Signed)
 Patient desated during procedure requiring ambu bag for 1-2 minutes. Patient oxygen saturation now stable, patient alert and oriented with no complaints. Will continue to monitor.

## 2024-08-19 NOTE — Progress Notes (Signed)
 Patient going to MRI will check back tomorrow for EEG.

## 2024-08-19 NOTE — H&P (Addendum)
 " Telemedicine History and Physical    Referring Provider: Fairy Gravely DO Telemedicine Provider: Donalda Applebaum MD Provider Location: [facility] Patient Location: [facility] Referring Diagnosis: Ankle fracture Patient Name and DOB verified: yes Patient consented to Telemedicine Evaluation:yes RN virtual assistant: Adelita Armour RN Video encounter time and date: 08/19/24 at    Patient: Lauren [name] FMW:[id number] DOB: 20-May-1956 PCP: Stephane Leita DEL, MD    Chief Complaint:  Chief Complaint  Patient presents with   Fall   HPI: Lauren [name] is a 69 y.o. female with medical history significant of HLD, HTN, DM-Coale, propranolol  who presented to the ED after a mechanical fall and subsequently injured her right ankle.  Prior history of pain-earlier this morning-patient got up to go to the bathroom-she apparently ambulates with the help of a walker-she then started having a sensation of electric shocks passing through her body followed by whole body spasms/involuntary (see below-has had similar issues for 2 weeks now)-in her leg just gave out and she fell to the ground injuring her right ankle.  She denies any loss of consciousness.  Denies any chest pain or shortness of breath.  No recent nausea, vomiting or diarrhea.  She was subsequently brought to the ED by EMS-upon further evaluation-she was found to have a right trimalleolar ankle fracture.  She underwent closed reduction and splint placement in the emergency room-subsequently orthopedic service was consulted with recommendations to admit to the hospitalist service-given that the fracture is unstable-patient was thought to need ORIF during this hospitalization.  Per patient-for the past 2-3 weeks-she has had episodes-where she gets these electric shock like sensation in her extremities/body-which at then followed by involuntary movements.  These episodes occur without any provocation.  These episodes are not postural.  Per  patient-some of these episodes are mild-in the last for just a minute or so, some of her major episodes last approximately 5 minutes.  Apparently she was recently seen by her PCP and started on baclofen  for these movements-her PCP was in the process of arranging for her to get an MRI of her brain-and then be seen by a neurologist.   No fever No headache No chest pain No shortness of breath No nausea, vomiting or diarrhea No abdominal pain Hematochezia melena No hematuria.  Review of Systems: As mentioned in the history of present illness. All other systems reviewed and are negative. 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   Cataract 2023   Depression    DES exposure in utero    Diabetes mellitus without complication (HCC)    Diverticulosis of colon    Dysphagia    Endometriosis    Essential tremor    GERD (gastroesophageal reflux disease)    Hx of adenomatous polyp of colon 05/2017   Hyperlipidemia    Hypertension    Neuroendocrine carcinoma of appendix (HCC)    Obesity    Osteopenia    Osteoporosis    Prediabetes    Past Surgical History:  Procedure Laterality Date   APPENDECTOMY  11/2005   BUNIONECTOMY     CATARACT EXTRACTION Right 02/06/2022   CATARACT EXTRACTION Left 02/20/2022   OOPHORECTOMY     TOTAL ABDOMINAL HYSTERECTOMY  2007   TOTAL KNEE ARTHROPLASTY Left 11/13/2021   Social History:  reports that she has never smoked. She has never used smokeless tobacco. She reports that she does not currently use alcohol. She  reports that she does not use drugs.  Allergies[1]  Family History  Problem Relation Age of Onset   Breast cancer Mother    Lung cancer Mother    Alcoholism Mother    Arthritis Mother    Bone cancer Father    Brain cancer Father    Hypertension Father    Lung cancer Father    Throat cancer Brother    Lung cancer Brother    Other Brother        Neuroendocrine  tumors   Bone cancer Brother    Hodgkin's lymphoma Brother    Diabetes Maternal Grandfather    Lung cancer Paternal Grandmother    Stomach cancer Paternal Grandfather    Rectal cancer Neg Hx    Colon cancer Neg Hx     Prior to Admission medications  Medication Sig Start Date End Date Taking? Authorizing Provider  alendronate (FOSAMAX) 70 MG tablet 1 tablet 30 minutes before the first food, beverage or medicine of the day with plain water Orally once a week 05/16/23   [provider]  amitriptyline  (ELAVIL ) 25 MG tablet Take 25 mg by mouth at bedtime.    [provider]  amoxicillin (AMOXIL) 500 MG capsule Take 1,000 mg by mouth 2 (two) times daily. Pre-Dental 02/10/24   [provider]  atorvastatin  (LIPITOR) 10 MG tablet Take 1 tablet (10 mg total) by mouth at bedtime. 08/12/24   Tolia, Sunit, DO  b complex vitamins capsule Take 1 capsule by mouth daily.    [provider]  buPROPion  (WELLBUTRIN  XL) 300 MG 24 hr tablet Take 300 mg by mouth daily. 10/03/23   [provider]  cefdinir  (OMNICEF ) 300 MG capsule Take 1 capsule (300 mg total) by mouth 2 (two) times daily. 08/09/24   Jonnal, Aparna Hima, MD  cetirizine (ZYRTEC) 10 MG tablet Take 10 mg by mouth at bedtime.    [provider]  Cholecalciferol  (VITAMIN D3) 50 MCG (2000 UT) CAPS Take 2,000 Units by mouth daily.    [provider]  fexofenadine (ALLEGRA) 180 MG tablet Take 180 mg by mouth as needed. Alternating with cetirizine    [provider]  fluticasone  (FLONASE ) 50 MCG/ACT nasal spray Place 2 sprays into both nostrils daily.    [provider]  furosemide (LASIX) 20 MG tablet Take 1 tablet by mouth as needed. 07/13/19   [provider]  Magnesium Oxide -Mg Supplement 500 MG TABS Take 2 tablets by mouth nightly Oral 02/01/20   [provider]  metFORMIN (GLUCOPHAGE) 1000 MG tablet Take 1,000 mg by mouth 2 (two) times daily. 12/27/23    [provider]  pantoprazole  (PROTONIX ) 20 MG tablet 1 tablet 1/2 to 1 hour before morning meal Orally Once a day    [provider]  propranolol  (INDERAL ) 20 MG tablet Take 20 mg by mouth daily. 01/27/21   [provider]    Physical Exam: done by Adelita Armour RN during this encounter  Gen Exam:Alert awake-not in any distress HEENT:atraumatic, normocephalic Chest: B/L clear to auscultation anteriorly CVS:S1S2 regular Abdomen:soft non tender, non distended Extremities:no edema Neurology: Non focal Skin: no rash   Data Reviewed:    Latest Ref Rng & Units 08/19/2024    9:23 AM 08/09/2024   12:15 PM 01/15/2024    3:55 PM  CBC  WBC 4.0 - 10.5 K/uL 11.0  7.1  8.0   Hemoglobin 12.0 - 15.0 g/dL 87.8  87.2  85.3   Hematocrit 36.0 -  46.0 % 37.9  37.6  43.4   Platelets 150 - 400 K/uL 281  283  342.0         Latest Ref Rng & Units 08/19/2024    9:23 AM 08/09/2024   12:15 PM 01/15/2024    3:55 PM  BMP  Glucose 70 - 99 mg/dL 889  95  898   BUN 8 - 23 mg/dL 14  14  27    Creatinine 0.44 - 1.00 mg/dL 9.24  9.23  9.13   Sodium 135 - 145 mmol/L 139  137  138   Potassium 3.5 - 5.1 mmol/L 4.0  4.1  4.0   Chloride 98 - 111 mmol/L 101  99  97   CO2 22 - 32 mmol/L 28  27  33   Calcium  8.9 - 10.3 mg/dL 8.9  9.7  9.7      Assessment and Plan: Trimalleolar right ankle fracture Secondary to a mechanical fall following involuntary movement of her body.  Patient denies any syncope. S/p closed reduction and splinting in the ED-by EDP Orthopedics has been consulted with recommendations for ORIF over the next several days Per orthopedics-nonweightbearing to RLE with maximal elevation Will await formal recommendations from the orthopedic service.  Essential tremors This is a chronic issue-has been going on for years Continue propranolol   Myoclonic jerks/involuntary movements Apparently ongoing for the past 2-3 weeks only-these episodes are preceded by  neuropathy/electric shocks going through her body. No recent changes to her medications apart from baclofen  which was just started a couple of days ago by her PCP Family asking if we could get an MRI of her brain done while she is inpatient.  I will discuss case with neurology but suspect she needs to be seen by a movement specialist which we do not have in the inpatient setting.  Addendum-case discussed with neurologist on-call Dr. Marjie reviewed the chart-and recommended that we add on a MRI of her C-spine in addition to MRI of the brain.  He also recommended an EEG.  If new imaging/EEG was nondiagnostic-he then suggested reconsulting neurology for formal evaluation.  HLD Statin  GERD PPI  Prediabetes Hold oral hypoglycemics-start SSI and follow.  Remote history of carcinoid tumor of the appendix-s/p TAH/BSO/debulking of endometriosis/partial corpectomy and appendectomy in 2007 Currently observation/surveillance-follows with Dr. Arley Hof at the cancer center   Advance Care Planning:   Code Status: Full Code   Consults: Ortho  Family Communication: Spouse at bedside  Severity of Illness: The appropriate patient status for this patient is INPATIENT. Inpatient status is judged to be reasonable and necessary in order to provide the required intensity of service to ensure the patient's safety. The patient's presenting symptoms, physical exam findings, and initial radiographic and laboratory data in the context of their chronic comorbidities is felt to place them at high risk for further clinical deterioration. Furthermore, it is not anticipated that the patient will be medically stable for discharge from the hospital within 2 midnights of admission.   * I certify that at the point of admission it is my clinical judgment that the patient will require inpatient hospital care spanning beyond 2 midnights from the point of admission due to high intensity of service, high risk for  further deterioration and high frequency of surveillance required.*  Author: Donalda Applebaum, MD 08/19/2024 12:45 PM  For on call review [email or web address].      [1]  Allergies Allergen Reactions   Codeine Itching   Sulfa Antibiotics Diarrhea and Nausea And Vomiting   "

## 2024-08-19 NOTE — ED Triage Notes (Signed)
 Pt BIB EMS due to a fall - fall came from body spasms that have started occurring recently. Pt states she was referred to neurologist from PCP but unable to go.  Deformity to right ankle from fall - pulses palpable. Denies thinners

## 2024-08-19 NOTE — Care Plan (Signed)
 Orthopaedic Surgery Plan of Care Note   -history and imaging reviewed with requesting team (ER) -pt has right trimalleolar ankle fx s/p closed reduction in ER -admit to Hospitalist team -will discuss with OR control desk on timing of ORIF during admission -NWB RLE with max elevation -full consult note to follow   Lillia Mountain, MD Orthopaedic Surgery [facility]

## 2024-08-19 NOTE — Progress Notes (Signed)
 Patient complained of edema

## 2024-08-19 NOTE — Progress Notes (Signed)
 Orthopedic Tech Progress Note Patient Details:  Lauren [name] 11/22/1955 [id number]  Ortho Devices Type of Ortho Device: Ace wrap, Cotton web roll, Post (short leg) splint, Stirrup splint Ortho Device/Splint Location: right short leg fiberglass posterior and stirrup splint applied Ortho Device/Splint Interventions: Ordered, Application, Adjustment   Post Interventions Patient Tolerated: Well Instructions Provided: Adjustment of device, Care of device  Waylan Thom Loving 08/19/2024, 10:37 AM

## 2024-08-19 NOTE — ED Provider Notes (Signed)
 " [place] EMERGENCY DEPARTMENT AT [facility] Provider Note  CSN: [id number] Arrival date & time: 08/19/24 9167  Chief Complaint(s) Fall  HPI Lauren [name] is a 69 y.o. female who presents emergency room after a fall today.  Patient reports that she got up early this morning to go to the bathroom, states my leg gave out from under me and she fell to the ground.  Patient was ambulating with her walker.  She reports that over the last few weeks she has been having spasms where she loses strength and has caused falls.  She has followed up with her PCP who has ordered an outpatient MRI and a referral to neurology.  She denies any pain at this time.   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 Authorizing Provider  alendronate (FOSAMAX) 70 MG tablet 1 tablet 30 minutes before the first food, beverage or medicine of the day with plain water Orally once a week 05/16/23   [provider]  amitriptyline  (ELAVIL ) 25 MG tablet Take 25 mg by mouth at bedtime.    [provider]  amoxicillin (AMOXIL) 500 MG capsule Take 1,000 mg by mouth 2 (two) times daily. Pre-Dental 02/10/24   [provider]  atorvastatin  (LIPITOR) 10 MG tablet Take 1 tablet (10 mg total) by  mouth at bedtime. 08/12/24   Tolia, Sunit, DO  b complex vitamins capsule Take 1 capsule by mouth daily.    [provider]  buPROPion  (WELLBUTRIN  XL) 300 MG 24 hr tablet Take 300 mg by mouth daily. 10/03/23   [provider]  cefdinir  (OMNICEF ) 300 MG capsule Take 1 capsule (300 mg total) by mouth 2 (two) times daily. 08/09/24   Jonnal, Aparna Hima, MD  cetirizine (ZYRTEC) 10 MG tablet Take 10 mg by mouth at bedtime.    [provider]  Cholecalciferol  (VITAMIN D3) 50 MCG (2000 UT) CAPS Take 2,000 Units by mouth daily.    [provider]  fexofenadine (ALLEGRA) 180 MG tablet Take 180 mg by mouth as needed. Alternating with cetirizine    [provider]  fluticasone  (FLONASE ) 50 MCG/ACT nasal spray Place 2 sprays into both nostrils daily.    [provider]  furosemide (LASIX) 20 MG tablet Take 1 tablet by mouth as needed. 07/13/19   [provider]  Magnesium Oxide -Mg Supplement 500 MG TABS Take 2 tablets by mouth nightly Oral 02/01/20   [provider]  metFORMIN (GLUCOPHAGE) 1000 MG tablet Take 1,000 mg by mouth 2 (two) times daily. 12/27/23   [provider]  pantoprazole  (PROTONIX ) 20 MG tablet 1 tablet 1/2 to 1 hour before morning meal Orally Once a  day    [provider]  propranolol  (INDERAL ) 20 MG tablet Take 20 mg by mouth daily. 01/27/21   [provider]                                                                                                                                    Past Surgical History Past Surgical History:  Procedure Laterality Date   APPENDECTOMY  11/2005   BUNIONECTOMY     CATARACT EXTRACTION Right 02/06/2022   CATARACT EXTRACTION Left 02/20/2022   OOPHORECTOMY     TOTAL ABDOMINAL HYSTERECTOMY  2007   TOTAL KNEE ARTHROPLASTY Left 11/13/2021   Family History Family History  Problem Relation Age of Onset   Breast cancer Mother    Lung cancer Mother     Alcoholism Mother    Arthritis Mother    Bone cancer Father    Brain cancer Father    Hypertension Father    Lung cancer Father    Throat cancer Brother    Lung cancer Brother    Other Brother        Neuroendocrine tumors   Bone cancer Brother    Hodgkin's lymphoma Brother    Diabetes Maternal Grandfather    Lung cancer Paternal Grandmother    Stomach cancer Paternal Grandfather    Rectal cancer Neg Hx    Colon cancer Neg Hx     Social History Social History[1] Allergies Codeine and Sulfa antibiotics  Review of Systems Review of Systems  Physical Exam Vital Signs  I have reviewed the triage vital signs BP (!) 120/58 (BP Location: Right Arm)   Pulse 84   Temp 97.8 F (36.6 C) (Oral)   Resp 17   Wt 89.4 kg   SpO2 98%   BMI 32.78 kg/m   Physical Exam Vitals and nursing note reviewed.  Constitutional:      Appearance: She is not toxic-appearing.  HENT:     Head: Normocephalic and atraumatic.     Nose: Nose normal.  Eyes:     Pupils: Pupils are equal, round, and reactive to light.  Cardiovascular:     Rate and Rhythm: Normal rate.     Pulses: Normal pulses.  Abdominal:     General: Abdomen is flat. There is no distension.     Palpations: Abdomen is soft.     Tenderness: There is no abdominal tenderness.  Musculoskeletal:     Cervical back: Normal range of motion and neck supple. No rigidity.     Comments: No tenderness to palpation in the bilateral shoulders, upper arms, elbows, forearms or wrists.  No tenderness to palpation in the chest.  Pelvis stable, nontender.  No tenderness, deformities noted on bilateral upper legs, knees, lower legs or ankles.  Patient able to lift both legs from the bed.  Kerlix bandage placed by EMS on right ankle.  Deformity, no open fracture.  Lymphadenopathy:  Cervical: No cervical adenopathy.  Skin:    General: Skin is warm.  Neurological:     General: No focal deficit present.     Comments: A andO  x 3,  cranial nerves II through XII intact.  5-5 strength with straight leg raise bilaterally.  5-5 upper extremity strength.  No numbness in bilateral upper extremities.  Normal speech.     ED Results and Treatments Labs (all labs ordered are listed, but only abnormal results are displayed) Labs Reviewed  BASIC METABOLIC PANEL WITH GFR - Abnormal; Notable for the following components:      Result Value   Glucose, Bld 110 (*)    All other components within normal limits  CBC WITH DIFFERENTIAL/PLATELET - Abnormal; Notable for the following components:   WBC 11.0 (*)    Abs Immature Granulocytes 0.08 (*)    All other components within normal limits                                                                                                                          Radiology DG Ankle Complete Right Result Date: 08/19/2024 CLINICAL DATA:  Follow-up fracture, post reduction EXAM: RIGHT ANKLE - COMPLETE 3+ VIEW COMPARISON:  08/19/2024 FINDINGS: Reduction of bimalleolar fracture dislocation in plaster IMPRESSION: Postreduction of bimalleolar fracture dislocation now in plaster. Electronically Signed   By: Nancyann Burns M.D.   On: 08/19/2024 10:42   DG Ankle 2 Views Right Result Date: 08/19/2024 CLINICAL DATA:  Right ankle pain after fall EXAM: RIGHT ANKLE - 2 VIEW COMPARISON:  None Available. FINDINGS: Severe lateral dislocation of talus relative to distal tibia is noted. Severely displaced and comminuted distal right fibular fracture is noted as well as severely displaced medial malleolar fracture. IMPRESSION: Severe lateral dislocation of talus relative to distal tibia is noted with severely displaced and comminuted distal right fibular fracture as well as severely displaced medial malleolar fracture. Electronically Signed   By: Lynwood Landy Raddle M.D.   On: 08/19/2024 09:54   CT Cervical Spine Wo Contrast Result Date: 08/19/2024 EXAM: CT CERVICAL SPINE WITHOUT CONTRAST 08/19/2024 09:18:44 AM TECHNIQUE:  CT of the cervical spine was performed without the administration of intravenous contrast. Multiplanar reformatted images are provided for review. Automated exposure control, iterative reconstruction, and/or weight based adjustment of the mA/kV was utilized to reduce the radiation dose to as low as reasonably achievable. COMPARISON: CT head reported separately today. CLINICAL HISTORY: 69 year old female. Ataxia, cervical trauma, recent body spasms. FINDINGS: BONES AND ALIGNMENT: Straightening and mild reversal of the normal cervical lordosis. Mild degenerative anterolisthesis at C3-C4. No acute fracture or traumatic malalignment. DEGENERATIVE CHANGES: Chronic severe disc and endplate degeneration at C5-C6 and C6-C7, including some vacuum disc at those levels. Bulky and asymmetric chronic cervical facet arthropathy maximal on the left at C3-C4. SOFT TISSUES: No prevertebral soft tissue swelling. Negative visible non-contrast neck soft tissues. Negative visible non-contrast thoracic inlet. IMPRESSION: 1. No acute traumatic injury identified in the cervical spine.  2. Levels of advanced cervical disc / endplate and also facet degeneration. Electronically signed by: Helayne Hurst MD 08/19/2024 09:30 AM EST RP Workstation: [id number]   CT Lumbar Spine Wo Contrast Result Date: 08/19/2024 EXAM: CT OF THE LUMBAR SPINE WITHOUT CONTRAST 08/19/2024 09:18:44 AM TECHNIQUE: CT of the lumbar spine was performed without the administration of intravenous contrast. Multiplanar reformatted images are provided for review. Automated exposure control, iterative reconstruction, and/or weight based adjustment of the mA/kV was utilized to reduce the radiation dose to as low as reasonably achievable. COMPARISON: Lumbar MRI 11/10/2022. CLINICAL HISTORY: Patient is a 69 year old female. Back trauma, no prior imaging. Recent body spasms. FINDINGS: Normal lumbar segmentation. BONES AND ALIGNMENT: Mild levoconvex lumbar scoliosis with apex at  L2-L3, and mild dextroconvex lower lumbar scoliosis with apex at L5. Stable lordosis. No significant spondylolisthesis. Normal vertebral body heights. No acute fracture or suspicious bone lesion. Intact visible sacrum and SI joints. SOFT TISSUES: Negative lumbar paraspinal soft tissues. Mild to moderate calcified aortoiliac sclerosis. Partially visible distended urinary bladder. Negative visible non-contrast abdominal viscera. DEGENERATIVE: Lumbar spine degeneration does not appear significantly progressed from previous MRI. Chronic disc degeneration is maximal at L2-L3 and L5-S1 with vacuum disc at those levels. IMPRESSION: 1. No acute traumatic injury identified in the lumbar spine. 2. Mild scoliosis. Chronic lumbar degeneration without significant progression from 2024 MRI. 3. Aortic Atherosclerosis (ICD10-I70.0). Electronically signed by: Helayne Hurst MD 08/19/2024 09:27 AM EST RP Workstation: [id number]   CT Head Wo Contrast Result Date: 08/19/2024 EXAM: CT HEAD WITHOUT CONTRAST 08/19/2024 09:18:44 AM TECHNIQUE: CT of the head was performed without the administration of intravenous contrast. Automated exposure control, iterative reconstruction, and/or weight based adjustment of the mA/kV was utilized to reduce the radiation dose to as low as reasonably achievable. COMPARISON: CT head 08/09/2024. CLINICAL HISTORY: 69 year old female with ataxia, head trauma, and recent body spasms. FINDINGS: BRAIN AND VENTRICLES: No acute hemorrhage. No evidence of acute infarct. No hydrocephalus. No extra-axial collection. No mass effect or midline shift. Incidental choroid plexus cysts, normal variant. Stable and normal for age brain volume, gray white differentiation. No suspicious intracranial vascular hyperdensity. Mild for age calcified atherosclerosis at the skull base. ORBITS: No acute abnormality. SINUSES: Paranasal sinuses, tympanic cavities, and mastoids remain well aerated. SOFT TISSUES AND SKULL: No acute soft  tissue abnormality. No skull fracture. IMPRESSION: 1. Normal for age non-contrast head CT.  No acute traumatic injury identified. Electronically signed by: Helayne Hurst MD 08/19/2024 09:23 AM EST RP Workstation: [id number]    Pertinent labs & imaging results that were available during my care of the patient were reviewed by me and considered in my medical decision making (see MDM for details).  Medications Ordered in ED Medications  propofol  (DIPRIVAN ) 10 mg/mL bolus/IV push 44.7 mg (has no administration in time range)  Procedures .Reduction of fracture  Date/Time: 08/19/2024 11:03 AM  Performed by: Mannie Fairy DASEN, DO Authorized by: Mannie Fairy DASEN, DO  Consent: Written consent obtained Consent given by: patient Patient understanding: patient states understanding of the procedure being performed Patient consent: the patient's understanding of the procedure matches consent given Procedure consent: procedure consent matches procedure scheduled Imaging studies: imaging studies available  Sedation: Patient sedated: yes Sedation type: moderate (conscious) sedation Sedatives: propofol     .Sedation  Date/Time: 08/19/2024 11:03 AM  Performed by: Mannie Fairy DASEN, DO Authorized by: Mannie Fairy DASEN, DO   Consent:    Consent obtained:  Written   Risks discussed:  Prolonged sedation necessitating reversal, prolonged hypoxia resulting in organ damage and vomiting Universal protocol:    Immediately prior to procedure, a time out was called: yes     Patient identity confirmed:  Verbally with patient Indications:    Procedure performed:  Fracture reduction   Procedure necessitating sedation performed by:  Physician performing sedation Pre-sedation assessment:    Time since last food or drink:  4   ASA classification: class 2 - patient with mild systemic  disease     Mouth opening:  3 or more finger widths   Thyromental distance:  3 finger widths   Mallampati score:  III - soft palate, base of uvula visible   Neck mobility: reduced     Pre-sedation assessments completed and reviewed: airway patency, mental status and respiratory function   A pre-sedation assessment was completed prior to the start of the procedure Procedure details (see MAR for exact dosages):    Intended level of sedation: moderate (conscious sedation)   Intra-procedure monitoring:  Blood pressure monitoring, continuous capnometry, cardiac monitor and continuous pulse oximetry   Intra-procedure events: hypoxia and respiratory depression     Intra-procedure management:  BVM ventilation   Total Provider sedation time (minutes):  15 Post-procedure details:   A post-sedation assessment was completed following the completion of the procedure.   Attendance: Constant attendance by certified staff until patient recovered     Post-sedation assessments completed and reviewed: airway patency, mental status and respiratory function     Procedure completion:  Tolerated well, no immediate complications Comments:     Period of hypoxia and apnea following second propofol  bolus.  Patient bagged easily, O2 sats improved.  Once patient began to wake, respiratory drive improved.  Patient returned to baseline.   (including critical care time)  Medical Decision Making / ED Course   This patient presents to the ED for concern of ankle pain, fall, this involves an extensive number of treatment options, and is a complaint that carries with it a high risk of complications and morbidity.  The differential diagnosis includes ankle sprain, ankle fracture, musculoskeletal injury, lumbar spinal stenosis, less likely cord compression, consider metastatic disease, less likely ICH.  MDM: Patient overall looks quite well.  She tells me she has not any pain.  Sounds though she has been having episodes of  weakness in her lower extremities.  She has no neurological deficits on my exam today.  With the fall, will obtain imaging of the patient's head, neck.  Will obtain imaging of the patient's lumbar spine due to reports of periodic weakness.  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  Routine blood work ordered.  Plain films of the ankle ordered.  Reassessment 10:55 AM-patient with pretty significant ankle fracture with dislocation.  Performed procedural sedation, reduced fracture.  Did reach out to orthopedic surgery and spoke with Dr. Kristie who reviewed postreduction films and said they were appropriate.  He recommends CT imaging of the ankle.  Believe patient requires admission for pain control, further workup regarding her recurrent falls and tremors.  Patient is able to eat today, orthopedic surgery will evaluate the patient later today for operative intervention.   Additional history obtained: -Additional history obtained from EMS -External records from outside source obtained and reviewed including: Previous PCP office note, prior ED visit.  Lab Tests: -I ordered, reviewed, and interpreted labs.   The pertinent results include:   Labs Reviewed  BASIC METABOLIC PANEL WITH GFR - Abnormal; Notable for the following components:      Result Value   Glucose, Bld 110 (*)    All other components within normal limits  CBC WITH DIFFERENTIAL/PLATELET - Abnormal; Notable for the following components:   WBC 11.0 (*)    Abs Immature Granulocytes 0.08 (*)    All other components within normal limits       Imaging Studies ordered: I ordered imaging studies including ankle plain films, CT imaging of head, neck, lumbar spine I independently visualized and interpreted imaging. I agree with the radiologist interpretation   Medicines ordered and prescription drug management: Meds ordered this encounter  Medications   propofol  (DIPRIVAN ) 10  mg/mL bolus/IV push 44.7 mg    -I have reviewed the patients home medicines and have made adjustments as needed   Cardiac Monitoring: The patient was maintained on a cardiac monitor.  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   Cataract 2023   Depression    DES exposure in utero    Diabetes mellitus without complication (HCC)    Diverticulosis of colon    Dysphagia    Endometriosis    Essential tremor    GERD (gastroesophageal reflux disease)    Hx of adenomatous polyp of colon 05/2017   Hyperlipidemia    Hypertension    Neuroendocrine carcinoma of appendix (HCC)    Obesity    Osteopenia    Osteoporosis    Prediabetes        Final Clinical Impression(s) / ED Diagnoses Final diagnoses:  Closed bimalleolar fracture of right ankle, initial encounter     @PCDICTATION @       [1] Social History Tobacco Use   Smoking status: Never   Smokeless tobacco: Never  Vaping Use   Vaping status: Never Used  Substance Use Topics   Alcohol use: Not Currently   Drug use: Never    Mannie Pac T, DO 08/19/24 1106  "

## 2024-08-20 ENCOUNTER — Inpatient Hospital Stay (HOSPITAL_COMMUNITY): Admit: 2024-08-20

## 2024-08-20 LAB — COMPREHENSIVE METABOLIC PANEL WITH GFR
ALT: 16 U/L (ref 0–44)
AST: 23 U/L (ref 15–41)
Albumin: 3.7 g/dL (ref 3.5–5.0)
Alkaline Phosphatase: 84 U/L (ref 38–126)
Anion gap: 11 (ref 5–15)
BUN: 12 mg/dL (ref 8–23)
CO2: 27 mmol/L (ref 22–32)
Calcium: 9.9 mg/dL (ref 8.9–10.3)
Chloride: 99 mmol/L (ref 98–111)
Creatinine, Ser: 0.77 mg/dL (ref 0.44–1.00)
GFR, Estimated: >60 mL/min
Glucose, Bld: 113 mg/dL — ABNORMAL HIGH (ref 70–99)
Potassium: 4.3 mmol/L (ref 3.5–5.1)
Sodium: 136 mmol/L (ref 135–145)
Total Bilirubin: 0.5 mg/dL (ref 0.0–1.2)
Total Protein: 6 g/dL — ABNORMAL LOW (ref 6.5–8.1)

## 2024-08-20 LAB — BLOOD GAS, VENOUS
Acid-Base Excess: 8.7 mmol/L — ABNORMAL HIGH (ref 0.0–2.0)
Bicarbonate: 34.9 mmol/L — ABNORMAL HIGH (ref 20.0–28.0)
Drawn by: 5267
O2 Saturation: 30.6 %
Patient temperature: 37.1
pCO2, Ven: 55 mmHg (ref 44–60)
pH, Ven: 7.41 (ref 7.25–7.43)
pO2, Ven: <31 mmHg — CL (ref 32–45)

## 2024-08-20 LAB — GLUCOSE, CAPILLARY
Glucose-Capillary: 121 mg/dL — ABNORMAL HIGH (ref 70–99)
Glucose-Capillary: 107 mg/dL — ABNORMAL HIGH (ref 70–99)
Glucose-Capillary: 92 mg/dL (ref 70–99)
Glucose-Capillary: 138 mg/dL — ABNORMAL HIGH (ref 70–99)

## 2024-08-20 LAB — CBC
HCT: 40 % (ref 36.0–46.0)
Hemoglobin: 12.2 g/dL (ref 12.0–15.0)
MCH: 29 pg (ref 26.0–34.0)
MCHC: 30.5 g/dL (ref 30.0–36.0)
MCV: 95.2 fL (ref 80.0–100.0)
Platelets: 263 10*3/uL (ref 150–400)
RBC: 4.2 MIL/uL (ref 3.87–5.11)
RDW: 13.2 % (ref 11.5–15.5)
WBC: 8.4 10*3/uL (ref 4.0–10.5)
nRBC: 0 % (ref 0.0–0.2)

## 2024-08-20 LAB — TSH: TSH: 5.84 u[IU]/mL — ABNORMAL HIGH (ref 0.350–4.500)

## 2024-08-20 LAB — MAGNESIUM: Magnesium: 2.2 mg/dL (ref 1.7–2.4)

## 2024-08-20 LAB — VITAMIN B12: Vitamin B-12: 639 pg/mL (ref 180–914)

## 2024-08-20 LAB — AMMONIA: Ammonia: <13 umol/L (ref 9–35)

## 2024-08-20 LAB — HEMOGLOBIN A1C
Hgb A1c MFr Bld: 5.9 % — ABNORMAL HIGH (ref 4.8–5.6)
Mean Plasma Glucose: 122.63 mg/dL

## 2024-08-20 LAB — CK: Total CK: 43 U/L (ref 38–234)

## 2024-08-20 MED ORDER — BACLOFEN 10 MG PO TABS
5.0000 mg | ORAL_TABLET | Freq: Once | ORAL | Status: AC
  Administered 2024-08-20: 5 mg via ORAL
  Filled 2024-08-20: qty 1

## 2024-08-20 NOTE — Consult Note (Addendum)
 NEUROLOGY CONSULT NOTE   Date of service: August 20, 2024 Patient Name: Lauren [name] MRN:  [id number] DOB:  09/27/1955 Chief Complaint: foot hurts Requesting Provider: Cherlyn Labella, MD  History of Present Illness  Lauren [name] is a 69 y.o. female with hx of HLD, HTN, ET on propranolol  who presented to the ED after a mechanical fall and subsequently injured her right ankle. She had ankle reduction in the ED and admitted for further workup of falls and later scheduled ORIF.   Patient seen this afternoon with son at bedside. Pt says she has had shaking episodes for the past 6 months, and a hand tremor for the past 3-4 months. She was started on propranolol  by her PCP, which she says helped the hand tremors. Patient says these shaking episodes typically happen at the end of the day, while at rest, after she is done playing with her grandchildren. She says she has episodes where both her legs shake, or just one leg will shake. She says this happens to her bilateral UE as well, or can occur with just one UE. She denies one side occurring more frequently than the other. She denies episodes where her legs and arms shake at the same time. She denies any LOC or confusion during these episodes. She says these movements are typically irregular, and will only last a few seconds. She says when it first started, these episodes would occur only a few times each week, but have been occurring more frequently, a few times a day as of lately. Patient denies any new changes to her medications, says the baclofen  her PCP prescribed has been helpful. Her son says he has only noticed brief spasms that resolve before he notices any persistent movements. She denies any new weakness, hx of seizures, sensory or vision changes, though endorses she has been more forgetful and struggling with her memory since last August.  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   Cataract 2023   Depression    DES exposure in utero    Diabetes mellitus without complication (HCC)    Diverticulosis of colon    Dysphagia    Endometriosis    Essential tremor    GERD (gastroesophageal reflux disease)    Hx of adenomatous polyp of colon 05/2017   Hyperlipidemia    Hypertension    Neuroendocrine carcinoma of appendix (HCC)    Obesity    Osteopenia    Osteoporosis    Prediabetes     Past Surgical History:  Procedure Laterality Date   APPENDECTOMY  11/2005   BUNIONECTOMY     CATARACT EXTRACTION Right 02/06/2022   CATARACT EXTRACTION Left 02/20/2022   OOPHORECTOMY     TOTAL ABDOMINAL HYSTERECTOMY  2007   TOTAL KNEE ARTHROPLASTY Left 11/13/2021    Family History: Family History  Problem Relation Age of Onset   Breast cancer Mother    Lung cancer Mother    Alcoholism Mother    Arthritis Mother    Bone cancer Father    Brain cancer Father    Hypertension Father    Lung cancer Father    Throat cancer Brother    Lung cancer Brother    Other Brother        Neuroendocrine tumors   Bone cancer Brother  Hodgkin's lymphoma Brother    Diabetes Maternal Grandfather    Lung cancer Paternal Grandmother    Stomach cancer Paternal Grandfather    Rectal cancer Neg Hx    Colon cancer Neg Hx    Social History  reports that she has never smoked. She has never used smokeless tobacco. She reports that she does not currently use alcohol. She reports that she does not use drugs.  Allergies[1]  Medications  Current Medications[2]  Vitals   Vitals:   2024/08/28 1859 08/28/2024 2139 08/20/24 0218 08/20/24 0552  BP: 106/66 110/70 102/62 104/65  Pulse: 84 93 100 97  Resp: 16 18 18 18   Temp: (!) 97.4 F (36.3 C) 97.9 F (36.6 C) 99.5 F (37.5 C) 98.1 F (36.7 C)  TempSrc: Oral Oral Oral Oral  SpO2: 98% 96% 97% 94%  Weight:       Height:        Body mass index is 32.78 kg/m.  Physical Exam   Constitutional: Appears well-developed and well-nourished, wrapped RLE Psych: Affect appropriate to situation Eyes: No scleral injection HENT: No OP obstruction Head: Normocephalic Respiratory: Effort normal, non-labored breathing Skin: WDI  Neurologic Examination   Mental Status: Patient is awake, alert, oriented to person, place, month, year, and situation Patient is able to give a clear and coherent history No signs of aphasia or neglect Cranial Nerves: II: Visual Fields are full. Pupils are equal, round, and reactive to light III,IV, VI: EOMI without ptosis or diploplia.  V: Facial sensation is symmetric to temperature VII: Facial movement is symmetric.  VIII: hearing is intact to voice X: Uvula elevates symmetrically XII: tongue is midline without atrophy or fasciculations.  Motor: Tone is normal. Bulk is normal. 5/5 strength was present in bilat UE and LLE, good antigravity of RLE, did not assess strength 2/2 pain Sensory: Sensation is symmetric to light touch and temperature in the arms and legs Cerebellar: FNF bilaterally  Labs/Imaging/Neurodiagnostic studies   CBC:  Recent Labs  Lab 28-Aug-2024 0923 2024-08-28 1351 08/20/24 0327  WBC 11.0* 9.3 8.4  NEUTROABS 7.6  --   --   HGB 12.1 12.8 12.2  HCT 37.9 38.8 40.0  MCV 92.7 92.6 95.2  PLT 281 284 263   Basic Metabolic Panel:  Lab Results  Component Value Date   NA 136 08/20/2024   K 4.3 08/20/2024   CO2 27 08/20/2024   GLUCOSE 113 (H) 08/20/2024   BUN 12 08/20/2024   CREATININE 0.77 08/20/2024   CALCIUM  9.9 08/20/2024   GFRNONAA >60 08/20/2024   GFRAA 77 09/08/2020   Lipid Panel:  Lab Results  Component Value Date   LDLCALC 80 10/26/2020   HgbA1c:  Lab Results  Component Value Date   HGBA1C 5.9 (H) 08/20/2024   Urine Drug Screen: No results found for: LABOPIA, COCAINSCRNUR, LABBENZ, AMPHETMU, THCU, LABBARB  Alcohol  Level No results found for: ETH INR No results found for: INR APTT No results found for: APTT AED levels: No results found for: PHENYTOIN, ZONISAMIDE, LAMOTRIGINE, LEVETIRACETA  CT Head without contrast (Personally reviewed): Normal for age non-contrast head CT  MRI Brain (Personally reviewed): No acute intracranial abnormality   MRI C-spine: 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 female with multifocal, irregular, sporadic jerking concerning for myoclonus. Workup by primary team thus far has been unremarkable, MRI without acute concerns for stenosis causing falls, electrolytes and renal function  without acute concerns. Primary also checking Vit b12, Vit B1,TSH, CK levels. Will order magnesium level, ammonia and VBG to rule out identifiable reversible causes of myoclonic jerks. Narcotics may very well be contributing to ongoing myoclonic jerks, though will be difficult to discontinue in the setting of her ankle fracture.   RECOMMENDATIONS   - VBG, ammonia and Mag level - Will place pharmacy consult for potential causes of myoclonus. - Would not continue baclofen  ______________________________________________________________________  Bonney Alfornia Light, DO [facility], PGY-1  I have seen the patient and reviewed the above note.  She clearly has asterixis on exam which is consistent with the problems which she is describing.  No clear etiology on her laboratory evaluation, but she is on multiple medications which could be contributing.  I would favor minimizing narcotics as these are common culprits.  VBG without hypercarbia which can cause this and ammonia is negative.  I have placed a pharmacy consult to evaluate for medications which can be associated with asterixis, and once the the possible culprits are identified I would choose the least likely  to cause harm to wean and discontinue.  No further recommendations other than try to minimize contributing medications.  Neurology will be available as needed, please call with further questions or concerns.  Aisha Seals, MD [facility]   If 7pm- 7am, please page neurology on call as listed in AMION. \    [1]  Allergies Allergen Reactions   Codeine Itching   Sulfa Antibiotics Diarrhea and Nausea And Vomiting  [2]  Current Facility-Administered Medications:    acetaminophen  (TYLENOL ) tablet 650 mg, 650 mg, Oral, Q6H PRN, 650 mg at 08/20/24 0828 **OR** acetaminophen  (TYLENOL ) suppository 650 mg, 650 mg, Rectal, Q6H PRN, Ghimire, Donalda HERO, MD   albuterol  (PROVENTIL ) (2.5 MG/3ML) 0.083% nebulizer solution 2.5 mg, 2.5 mg, Nebulization, Q2H PRN, Ghimire, Donalda HERO, MD   amitriptyline  (ELAVIL ) tablet 25 mg, 25 mg, Oral, QHS, Ghimire, Shanker M, MD, 25 mg at 08/19/24 2202   atorvastatin  (LIPITOR) tablet 10 mg, 10 mg, Oral, QHS, Ghimire, Shanker M, MD, 10 mg at 08/19/24 2202   bisacodyl  (DULCOLAX) suppository 10 mg, 10 mg, Rectal, Daily PRN, Ghimire, Shanker M, MD   buPROPion  (WELLBUTRIN  XL) 24 hr tablet 300 mg, 300 mg, Oral, Daily, Ghimire, Shanker M, MD, 300 mg at 08/20/24 0940   cholecalciferol  (VITAMIN D3) 25 MCG (1000 UNIT) tablet 2,000 Units, 2,000 Units, Oral, Daily, Ghimire, Shanker M, MD, 2,000 Units at 08/20/24 9060   enoxaparin  (LOVENOX ) injection 40 mg, 40 mg, Subcutaneous, Q24H, Ghimire, Shanker M, MD, 40 mg at 08/19/24 1410   fluticasone  (FLONASE ) 50 MCG/ACT nasal spray 2 spray, 2 spray, Each Nare, Daily, Ghimire, Shanker M, MD   HYDROmorphone  (DILAUDID ) injection 0.5 mg, 0.5 mg, Intravenous, Q4H PRN, Ghimire, Donalda HERO, MD   insulin  aspart (novoLOG ) injection 0-9 Units, 0-9 Units, Subcutaneous, TID WC, Ghimire, Donalda HERO, MD, 1 Units at 08/20/24 9176   loratadine  (CLARITIN ) tablet 10 mg, 10 mg, Oral, Daily, Ghimire, Shanker M, MD, 10 mg at 08/20/24 9060    multivitamin with minerals tablet 1 tablet, 1 tablet, Oral, Daily, Ghimire, Donalda HERO, MD, 1 tablet at 08/20/24 9060   naloxone  (NARCAN ) injection 0.4 mg, 0.4 mg, Intravenous, PRN, Ghimire, Donalda HERO, MD   ondansetron  (ZOFRAN ) tablet 4 mg, 4 mg, Oral, Q6H PRN **OR** ondansetron  (ZOFRAN ) injection 4 mg, 4 mg, Intravenous, Q6H PRN, Ghimire, Donalda HERO, MD   Oral care mouth rinse, 15 mL, Mouth Rinse, PRN, Ghimire, Donalda HERO, MD  oxyCODONE  (Oxy IR/ROXICODONE ) immediate release tablet 5 mg, 5 mg, Oral, Q4H PRN, Ghimire, Donalda HERO, MD, 5 mg at 08/20/24 9171   pantoprazole  (PROTONIX ) EC tablet 40 mg, 40 mg, Oral, Daily, Ghimire, Shanker M, MD, 40 mg at 08/20/24 0940   polyethylene glycol (MIRALAX  / GLYCOLAX ) packet 17 g, 17 g, Oral, Daily, Ghimire, Shanker M, MD, 17 g at 08/20/24 0940   propranolol  (INDERAL ) tablet 20 mg, 20 mg, Oral, Daily, Ghimire, Donalda HERO, MD, 20 mg at 08/20/24 0939   sodium phosphate (FLEET) enema 1 enema, 1 enema, Rectal, Once PRN, Ghimire, Donalda HERO, MD

## 2024-08-20 NOTE — Anesthesia Preprocedure Evaluation (Signed)
 "                                  Anesthesia Evaluation    Airway        Dental   Pulmonary           Cardiovascular hypertension,   HLD  Low-risk stress test 01/25/2020  Echocardiogram 01/06/2020: Normal LV systolic function with visual EF 55-60%. Left ventricle cavity is normal in size. Mild left ventricular hypertrophy. Normal global wall motion. Indeterminate diastolic filling pattern, normal LAP. Calculated EF 55%. Left atrial cavity is mildly dilated. Mild (Grade I) mitral regurgitation. IVC is dilated with a respiratory response of <50%. No prior study for comparison.     Neuro/Psych  PSYCHIATRIC DISORDERS  Depression    Essential tremor    GI/Hepatic ,GERD  Medicated,,Neuroendocrine tumor of appendix s/p appendectomy, dysphagia, diverticulosis    Endo/Other  diabetes, Type 2, Oral Hypoglycemic Agents    Renal/GU      Musculoskeletal  (+) Arthritis ,  Osteopenia    Abdominal   Peds  Hematology Lab Results      Component                Value               Date                      WBC                      8.4                 08/20/2024                HGB                      12.2                08/20/2024                HCT                      40.0                08/20/2024                MCV                      95.2                08/20/2024                PLT                      263                 08/20/2024              Anesthesia Other Findings   Reproductive/Obstetrics Endometriosis                               Anesthesia Physical Anesthesia Plan  ASA: 2  Anesthesia Plan: General   Post-op Pain Management: Regional block* and Tylenol  PO (pre-op)*   Induction: Intravenous  PONV Risk Score and Plan: 3 and Ondansetron , Dexamethasone and  Treatment may vary due to age or medical condition  Airway Management Planned: LMA  Additional Equipment:   Intra-op Plan:   Post-operative Plan: Extubation  in OR  Informed Consent:   Plan Discussed with:   Anesthesia Plan Comments:         Anesthesia Quick Evaluation  "

## 2024-08-20 NOTE — TOC Initial Note (Signed)
 Transition of Care [facility]) - Initial/Assessment Note    Patient Details  Name: Lauren [name] MRN: [id number] Date of Birth: Nov 19, 1955  Transition of Care [facility]) CM/SW Contact:    NORMAN ASPEN, LCSW Phone Number: 08/20/2024, 2:09 PM  Clinical Narrative:                  Met with pt, spouse and son today to introduce CSW role with dc planning needs.  Pt currently awaiting ortho sx for ankle fx.  She reports that her spouse and son are very supportive and will assist at dc - they confirm.  Discussed possible DME and follow up needs.  IP CM will follow along.  Expected Discharge Plan: Home w Home Health Services Barriers to Discharge: Continued Medical Work up   Patient Goals and CMS Choice Patient states their goals for this hospitalization and ongoing recovery are:: return home          Expected Discharge Plan and Services In-house Referral: Clinical Social Work     Living arrangements for the past 2 months: Single Family Home                                      Prior Living Arrangements/Services Living arrangements for the past 2 months: Single Family Home Lives with:: Spouse Patient language and need for interpreter reviewed:: Yes Do you feel safe going back to the place where you live?: Yes      Need for Family Participation in Patient Care: Yes (Comment) Care giver support system in place?: Yes (comment)   Criminal Activity/Legal Involvement Pertinent to Current Situation/Hospitalization: No - Comment as needed  Activities of Daily Living   ADL Screening (condition at time of admission) Independently performs ADLs?: Yes (appropriate for developmental age) Is the patient deaf or have difficulty hearing?: No Does the patient have difficulty seeing, even when wearing glasses/contacts?: No Does the patient have difficulty concentrating, remembering, or making decisions?: No  Permission Sought/Granted Permission sought to share information with : Family  Supports Permission granted to share information with : Yes, Verbal Permission Granted  Share Information with NAME: spouse, Honesty Menta @   [phone]           Emotional Assessment Appearance:: Appears stated age Attitude/Demeanor/Rapport: Gracious Affect (typically observed): Accepting Orientation: : Oriented to Self, Oriented to Place, Oriented to  Time, Oriented to Situation Alcohol / Substance Use: Not Applicable Psych Involvement: No (comment)  Admission diagnosis:  Closed right ankle fracture [S82.891A] Closed bimalleolar fracture of right ankle, initial encounter [D17.158J] Patient Active Problem List   Diagnosis Date Noted   Closed right ankle fracture 08/19/2024   Myoclonus 08/19/2024   Hyperlipidemia 08/19/2024   Gastroesophageal reflux disease 08/19/2024   Dysphagia 01/18/2024   PCP:  Stephane Leita DEL, MD Pharmacy:   [id number] [address] [place], [place] - [address] [place] [address] Phone: [phone] Fax: [phone]     Social Drivers of Health (SDOH) Social History: SDOH Screenings   Food Insecurity: No Food Insecurity (08/19/2024)  Housing: Low Risk (08/19/2024)  Transportation Needs: No Transportation Needs (08/19/2024)  Utilities: Not At Risk (08/19/2024)  Financial Resource Strain: Low Risk (11/20/2023)   Received from [facility]  Social Connections: Unknown (08/19/2024)  Recent Concern: Social Connections - Moderately Isolated (08/19/2024)  Tobacco Use: Low Risk (08/19/2024)   SDOH Interventions:     Readmission  Risk Interventions    08/20/2024    2:07 PM  Readmission Risk Prevention Plan  Post Dischage Appt Complete  Medication Screening Complete  Transportation Screening Complete

## 2024-08-20 NOTE — Care Plan (Signed)
 Orthopaedic Surgery Plan of Care Note   -history and imaging reviewed with requesting team (ER & IM) -added her on for ORIF surgery Monday 2/9 at 4 PM -please keep NPO and hold VTE ppx from midnight accordingly -full consult note to follow   Lillia Mountain, MD Orthopaedic Surgery [facility]

## 2024-08-20 NOTE — Plan of Care (Signed)
" °  Problem: Nutritional: Goal: Maintenance of adequate nutrition will improve Outcome: Adequate for Discharge   Problem: Skin Integrity: Goal: Risk for impaired skin integrity will decrease Outcome: Progressing   Problem: Tissue Perfusion: Goal: Adequacy of tissue perfusion will improve Outcome: Progressing   Problem: Clinical Measurements: Goal: Respiratory complications will improve Outcome: Progressing Goal: Cardiovascular complication will be avoided Outcome: Progressing   Problem: Activity: Goal: Risk for activity intolerance will decrease Outcome: Progressing   Problem: Nutrition: Goal: Adequate nutrition will be maintained Outcome: Completed/Met   Problem: Coping: Goal: Level of anxiety will decrease Outcome: Progressing   Problem: Elimination: Goal: Will not experience complications related to bowel motility Outcome: Progressing Goal: Will not experience complications related to urinary retention Outcome: Completed/Met   Problem: Pain Managment: Goal: General experience of comfort will improve and/or be controlled Outcome: Progressing   Problem: Safety: Goal: Ability to remain free from injury will improve Outcome: Progressing   Problem: Skin Integrity: Goal: Risk for impaired skin integrity will decrease Outcome: Progressing   "

## 2024-08-20 NOTE — Progress Notes (Addendum)
 MEDICATION RELATED CONSULT NOTE - INITIAL   Pharmacy Consult for medications that may cause  Myoclonus-asterixis Indication: shaking episodes and hand tremor for several months  Allergies[1]  Patient Measurements: Height: 5' 5 (165.1 cm) Weight: 89.4 kg (197 lb) IBW/kg (Calculated) : 57 Adjusted Body Weight:   Vital Signs: Temp: 98.7 F (37.1 C) (02/06 1549) Temp Source: Oral (02/06 1549) BP: 101/45 (02/06 1549) Pulse Rate: 81 (02/06 1549) Intake/Output from previous day: 02/05 0701 - 02/06 0700 In: 120 [P.O.:120] Out: 400 [Urine:400] Intake/Output from this shift: No intake/output data recorded.  Labs: Recent Labs    08/19/24 0923 08/19/24 1351 08/20/24 0327 08/20/24 1432  WBC 11.0* 9.3 8.4  --   HGB 12.1 12.8 12.2  --   HCT 37.9 38.8 40.0  --   PLT 281 284 263  --   CREATININE 0.75 0.74 0.77  --   MG  --   --   --  2.2  ALBUMIN  --   --  3.7  --   PROT  --   --  6.0*  --   AST  --   --  23  --   ALT  --   --  16  --   ALKPHOS  --   --  84  --   BILITOT  --   --  0.5  --    Estimated Creatinine Clearance: 72.3 mL/min (by C-G formula based on SCr of 0.77 mg/dL).   Microbiology: No results found for this or any previous visit (from the past 720 hours).  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   Cataract 2023   Depression    DES exposure in utero    Diabetes mellitus without complication (HCC)    Diverticulosis of colon    Dysphagia    Endometriosis    Essential tremor    GERD (gastroesophageal reflux disease)    Hx of adenomatous polyp of colon 05/2017   Hyperlipidemia    Hypertension    Neuroendocrine carcinoma of appendix (HCC)    Obesity    Osteopenia    Osteoporosis    Prediabetes     Informational Assessment: - Psychotropic medications including antidepressants can cause this effect (patient has been on high-dose Bupropion  since at least  03/22/24) - Antibiotics including PCN's (3 x different Amoxil scripts last filled 03/2024) - Noted new baclofen  prescriptions filled 08/17/24   Lauren [name] Lauren [name], PharmD, BCPS Clinical Staff Pharmacist [name], [name] 08/20/2024,7:54 PM       [1]  Allergies Allergen Reactions   Codeine Itching   Sulfa Antibiotics Diarrhea and Nausea And Vomiting

## 2024-08-20 NOTE — ED Notes (Signed)
 08/20/2024 0724 late entry, during ankle reduction propofol  given 40 mg additional with verbal order from DO Stevens.

## 2024-08-20 NOTE — Progress Notes (Signed)
 "        [facility]                                                                               Lauren [name], is a 69 y.o. female, DOB - [birth year], FMW:[id number] Admit date - 08/19/2024    Outpatient Primary MD for the patient is Stephane, Leita DEL, MD  LOS - 1  days    Brief summary   Lauren [name] is a 69 y.o. female with medical history significant of HLD, HTN, DM,  essential tremors on propranolol  who presented to the ED after a mechanical fall and subsequently injured her right ankle. she was found to have a right trimalleolar ankle fracture.  As per the patient , she has been falling and having myoclonic jerks since 2 to 3 weeks. She underwent closed reduction and splint placement in the emergency room-subsequently orthopedic service was consulted with recommendations to admit to the hospitalist service-given that the fracture is unstable-patient was thought to need ORIF during this hospitalization.    Assessment & Plan    Assessment and Plan:  Trimalleolar right ankle fracture Secondary to to a fall and patient reports that right leg just gave out and she had an involuntary movement of her right leg. Orthopedics consulted and plan for ORIF when schedule allows Nonweightbearing on right lower extremity with maximal elevation. Continue with pain medication as needed Further recommendations to follow from orthopedics.    History of myoclonic jerks involuntary movements of extremities going on for about 2 to 3 weeks now along with lower extremity weakness MRI of the brain and cervical spine No acute intracranial abnormality. 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 MRI of the lumbar spine ordered for further evaluation Neurology consulted for recommendations. Checking Vit b12, Vit B1,TSH, CK levels.   Hyperlipidemia Holding statin for now.     Hypertension:  Well controlled.    Type 2 DM A1c is  5.9% Continue with SSI.    Essential tremors:  On propranolol .     Estimated body mass index is 32.78 kg/m as calculated from the following:   Height as of this encounter: 5' 5 (1.651 m).   Weight as of this encounter: 89.4 kg.  Code Status: full code.  DVT Prophylaxis:  enoxaparin  (LOVENOX ) injection 40 mg Start: 08/19/24 1300   Level of Care: Level of care: Telemetry Family Communication: FAMILY at bedside.   Disposition Plan:     Remains inpatient appropriate:  pending.    Procedures:  None.   Consultants:   Orthopedics.   Antimicrobials:   Anti-infectives (From admission, onward)    None        Medications  Scheduled Meds:  amitriptyline   25 mg Oral QHS   atorvastatin   10 mg Oral QHS   buPROPion   300 mg Oral Daily   cholecalciferol   2,000 Units Oral Daily   enoxaparin  (LOVENOX ) injection  40 mg Subcutaneous Q24H   fluticasone   2 spray Each Nare Daily   insulin  aspart  0-9 Units Subcutaneous TID WC   loratadine   10 mg Oral Daily   multivitamin with minerals  1 tablet Oral  Daily   pantoprazole   40 mg Oral Daily   polyethylene glycol  17 g Oral Daily   propranolol   20 mg Oral Daily   Continuous Infusions: PRN Meds:.acetaminophen  **OR** acetaminophen , albuterol , bisacodyl , HYDROmorphone  (DILAUDID ) injection, naLOXone  (NARCAN )  injection, ondansetron  **OR** ondansetron  (ZOFRAN ) IV, mouth rinse, oxyCODONE , sodium phosphate    Subjective:   Lauren [name] was seen and examined today.    Objective:   Vitals:   08/19/24 1859 08/19/24 2139 08/20/24 0218 08/20/24 0552  BP: 106/66 110/70 102/62 104/65  Pulse: 84 93 100 97  Resp: 16 18 18 18   Temp: (!) 97.4 F (36.3 C) 97.9 F (36.6 C) 99.5 F (37.5 C) 98.1 F (36.7 C)  TempSrc: Oral Oral Oral Oral  SpO2: 98% 96% 97% 94%  Weight:      Height:        Intake/Output Summary (Last 24 hours) at 08/20/2024 1337 Last data filed at 08/19/2024 1655 Gross per 24 hour  Intake 120 ml  Output 400 ml  Net  -280 ml   Filed Weights   08/19/24 1001  Weight: 89.4 kg     Exam General exam: Appears calm and comfortable  Respiratory system: Clear to auscultation. Respiratory effort normal. Cardiovascular system: S1 & S2 heard, RRR. No JVD, Gastrointestinal system: Abdomen is nondistended, soft and nontender.  Central nervous system: Alert and oriented. Grossly non focal. Extremities: RLE in splint. Elevated.  Skin: No rashes, Psychiatry: Mood & affect appropriate.    Data Reviewed:  I have personally reviewed following labs and imaging studies   CBC Lab Results  Component Value Date   WBC 8.4 08/20/2024   RBC 4.20 08/20/2024   HGB 12.2 08/20/2024   HCT 40.0 08/20/2024   MCV 95.2 08/20/2024   MCH 29.0 08/20/2024   PLT 263 08/20/2024   MCHC 30.5 08/20/2024   RDW 13.2 08/20/2024   LYMPHSABS 2.4 08/19/2024   MONOABS 0.7 08/19/2024   EOSABS 0.2 08/19/2024   BASOSABS 0.1 08/19/2024     Last metabolic panel Lab Results  Component Value Date   NA 136 08/20/2024   K 4.3 08/20/2024   CL 99 08/20/2024   CO2 27 08/20/2024   BUN 12 08/20/2024   CREATININE 0.77 08/20/2024   GLUCOSE 113 (H) 08/20/2024   GFRNONAA >60 08/20/2024   GFRAA 77 09/08/2020   CALCIUM  9.9 08/20/2024   PROT 6.0 (L) 08/20/2024   ALBUMIN 3.7 08/20/2024   LABGLOB 1.7 10/26/2020   AGRATIO 2.5 (H) 10/26/2020   BILITOT 0.5 08/20/2024   ALKPHOS 84 08/20/2024   AST 23 08/20/2024   ALT 16 08/20/2024   ANIONGAP 11 08/20/2024    CBG (last 3)  Recent Labs    08/19/24 2141 08/20/24 0728 08/20/24 1133  GLUCAP 125* 121* 107*      Coagulation Profile: No results for input(s): INR, PROTIME in the last 168 hours.   Radiology Studies: MR BRAIN W WO CONTRAST Result Date: 08/19/2024 EXAM: MRI BRAIN WITHOUT AND WITH CONTRAST MRI CERVICAL SPINE WITHOUT AND WITH CONTRAST 08/19/2024 06:19:30 PM TECHNIQUE: Multiplanar multisequence MRI of the brain was performed without and with the administration of  intravenous contrast. Multiplanar multisequence MRI of the cervical spine was performed without and with the administration of intravenous contrast. 9 mL (gadobutrol  (GADAVIST ) 1 MMOL/ML injection 9 mL GADOBUTROL  1 MMOL/ML IV SOLN) was administered. COMPARISON: None available. CLINICAL HISTORY: Parkinsonian syndrome. FINDINGS: MRI BRAIN: BRAIN AND VENTRICLES: No acute infarct. No acute intracranial hemorrhage. No mass. No midline shift. No hydrocephalus. The  sella is unremarkable. Normal flow voids. ORBITS: No significant abnormality. SINUSES AND MASTOIDS: No significant abnormality. BONES AND SOFT TISSUES: Normal bone marrow signal. No soft tissue abnormality. MRI CERVICAL SPINE: BONES AND ALIGNMENT: Straightening of normal cervical lordosis. Normal vertebral body heights. Marrow signal is unremarkable. SPINAL CORD: Normal spinal cord size. Normal spinal cord signal. SOFT TISSUES: No significant abnormality. C2-C3: Moderate right facet hypertrophy with moderate right foraminal stenosis. No spinal canal stenosis. C3-C4: Moderate left facet hypertrophy. No disc herniation. No central spinal canal or neural foraminal stenosis. C4-C5: Intermediate-sized central disc protrusion narrowing the ventral thecal sac. No spinal canal stenosis. No neural foraminal stenosis. C5-C6: Right subarticular/foraminal disc osteophyte complex and left uncovertebral hypertrophy. No central spinal canal stenosis. Severe right foraminal stenosis. C6-C7: Small disc bulge with bilateral uncovertebral hypertrophy. No central spinal canal stenosis. Mild right and severe left neural foraminal stenosis. C7-T1: No disc herniation. No spinal canal stenosis. No foraminal narrowing. IMPRESSION: 1. No acute intracranial abnormality. 2. 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 3. No spinal cord abnormality. Electronically signed by: Franky Stanford MD 08/19/2024 07:26 PM EST RP Workstation:  [id number]   MR CERVICAL SPINE W WO CONTRAST Result Date: 08/19/2024 EXAM: MRI BRAIN WITHOUT AND WITH CONTRAST MRI CERVICAL SPINE WITHOUT AND WITH CONTRAST 08/19/2024 06:19:30 PM TECHNIQUE: Multiplanar multisequence MRI of the brain was performed without and with the administration of intravenous contrast. Multiplanar multisequence MRI of the cervical spine was performed without and with the administration of intravenous contrast. 9 mL (gadobutrol  (GADAVIST ) 1 MMOL/ML injection 9 mL GADOBUTROL  1 MMOL/ML IV SOLN) was administered. COMPARISON: None available. CLINICAL HISTORY: Parkinsonian syndrome. FINDINGS: MRI BRAIN: BRAIN AND VENTRICLES: No acute infarct. No acute intracranial hemorrhage. No mass. No midline shift. No hydrocephalus. The sella is unremarkable. Normal flow voids. ORBITS: No significant abnormality. SINUSES AND MASTOIDS: No significant abnormality. BONES AND SOFT TISSUES: Normal bone marrow signal. No soft tissue abnormality. MRI CERVICAL SPINE: BONES AND ALIGNMENT: Straightening of normal cervical lordosis. Normal vertebral body heights. Marrow signal is unremarkable. SPINAL CORD: Normal spinal cord size. Normal spinal cord signal. SOFT TISSUES: No significant abnormality. C2-C3: Moderate right facet hypertrophy with moderate right foraminal stenosis. No spinal canal stenosis. C3-C4: Moderate left facet hypertrophy. No disc herniation. No central spinal canal or neural foraminal stenosis. C4-C5: Intermediate-sized central disc protrusion narrowing the ventral thecal sac. No spinal canal stenosis. No neural foraminal stenosis. C5-C6: Right subarticular/foraminal disc osteophyte complex and left uncovertebral hypertrophy. No central spinal canal stenosis. Severe right foraminal stenosis. C6-C7: Small disc bulge with bilateral uncovertebral hypertrophy. No central spinal canal stenosis. Mild right and severe left neural foraminal stenosis. C7-T1: No disc herniation. No spinal canal stenosis. No  foraminal narrowing. IMPRESSION: 1. No acute intracranial abnormality. 2. 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 3. No spinal cord abnormality. Electronically signed by: Franky Stanford MD 08/19/2024 07:26 PM EST RP Workstation: [id number]   CT Ankle Right Wo Contrast Result Date: 08/19/2024 CLINICAL DATA:  Ankle trauma.  Evaluate fracture post reduction. EXAM: CT OF THE RIGHT ANKLE WITHOUT CONTRAST TECHNIQUE: Multidetector CT imaging of the right ankle was performed according to the standard protocol. Multiplanar CT image reconstructions were also generated. RADIATION DOSE REDUCTION: This exam was performed according to the departmental dose-optimization program which includes automated exposure control, adjustment of the mA and/or kV according to patient size and/or use of iterative reconstruction technique. COMPARISON:  Radiographs 08/19/2024. FINDINGS: Bones/Joint/Cartilage The ankle  is splinted. There has been partial reduction of the previously demonstrated trimalleolar fracture dislocation. Comminuted nearly transverse fracture through the base of the medial malleolus demonstrates up to 6 mm of residual displacement. An intra-articular fracture of the posterior malleolus extends into the distal tibiofibular joint. The anterolateral aspect of the distal tibia is also fractured, with involvement of the tibiofibular joint. There is a comminuted and mildly displaced fracture of the distal fibular diaphysis which may be incompletely visualized. This demonstrates up to 6 mm of residual posterior and medial displacement. Persistent mild widening of the ankle mortise with lateral subluxation of the talus relative to the tibial plafond. Probable small osteochondral fracture of the talar dome anteromedially. Ankle joint effusion with small intra-articular loose bodies/fracture fragments. The subtalar joint appears intact. The additional tarsal bones appear  intact. Ligaments Suboptimally assessed by CT. As above, probable avulsion fractures of the anterolateral and posterolateral distal tibia mediated by the inferior tibiofibular ligaments. Muscles and Tendons Possible partial entrapment of the posterior tibialis tendon by the comminuted fracture involving the medial malleolus. As evaluated by CT, the additional ankle tendons appear intact. No significant tenosynovitis. Soft tissues Moderate soft tissue swelling medially and laterally. 2.6 x 1.8 cm subcutaneous lesion plantar to the calcaneal tuberosity demonstrates central low-density and may reflect a pressure lesion or pseudo bursa. No evidence of foreign body or soft tissue emphysema. IMPRESSION: 1. Partial reduction of the previously demonstrated trimalleolar fracture/dislocation as described. 2. Possible partial entrapment of the posterior tibialis tendon by the comminuted fracture involving the medial malleolus. 3. Probable small osteochondral fracture of the talar dome anteromedially. 4. Moderate soft tissue swelling medially and laterally. 5. Subcutaneous lesion plantar to the calcaneal tuberosity may reflect a pressure lesion or pseudo bursa. Electronically Signed   By: Elsie Perone M.D.   On: 08/19/2024 11:32   DG Ankle Complete Right Result Date: 08/19/2024 CLINICAL DATA:  Follow-up fracture, post reduction EXAM: RIGHT ANKLE - COMPLETE 3+ VIEW COMPARISON:  08/19/2024 FINDINGS: Reduction of bimalleolar fracture dislocation in plaster IMPRESSION: Postreduction of bimalleolar fracture dislocation now in plaster. Electronically Signed   By: Nancyann Burns M.D.   On: 08/19/2024 10:42   DG Ankle 2 Views Right Result Date: 08/19/2024 CLINICAL DATA:  Right ankle pain after fall EXAM: RIGHT ANKLE - 2 VIEW COMPARISON:  None Available. FINDINGS: Severe lateral dislocation of talus relative to distal tibia is noted. Severely displaced and comminuted distal right fibular fracture is noted as well as severely  displaced medial malleolar fracture. IMPRESSION: Severe lateral dislocation of talus relative to distal tibia is noted with severely displaced and comminuted distal right fibular fracture as well as severely displaced medial malleolar fracture. Electronically Signed   By: Lynwood Landy Raddle M.D.   On: 08/19/2024 09:54   CT Cervical Spine Wo Contrast Result Date: 08/19/2024 EXAM: CT CERVICAL SPINE WITHOUT CONTRAST 08/19/2024 09:18:44 AM TECHNIQUE: CT of the cervical spine was performed without the administration of intravenous contrast. Multiplanar reformatted images are provided for review. Automated exposure control, iterative reconstruction, and/or weight based adjustment of the mA/kV was utilized to reduce the radiation dose to as low as reasonably achievable. COMPARISON: CT head reported separately today. CLINICAL HISTORY: 69 year old female. Ataxia, cervical trauma, recent body spasms. FINDINGS: BONES AND ALIGNMENT: Straightening and mild reversal of the normal cervical lordosis. Mild degenerative anterolisthesis at C3-C4. No acute fracture or traumatic malalignment. DEGENERATIVE CHANGES: Chronic severe disc and endplate degeneration at C5-C6 and C6-C7, including some vacuum disc at those levels. Bulky and asymmetric  chronic cervical facet arthropathy maximal on the left at C3-C4. SOFT TISSUES: No prevertebral soft tissue swelling. Negative visible non-contrast neck soft tissues. Negative visible non-contrast thoracic inlet. IMPRESSION: 1. No acute traumatic injury identified in the cervical spine. 2. Levels of advanced cervical disc / endplate and also facet degeneration. Electronically signed by: Helayne Hurst MD 08/19/2024 09:30 AM EST RP Workstation: [id number]   CT Lumbar Spine Wo Contrast Result Date: 08/19/2024 EXAM: CT OF THE LUMBAR SPINE WITHOUT CONTRAST 08/19/2024 09:18:44 AM TECHNIQUE: CT of the lumbar spine was performed without the administration of intravenous contrast. Multiplanar reformatted  images are provided for review. Automated exposure control, iterative reconstruction, and/or weight based adjustment of the mA/kV was utilized to reduce the radiation dose to as low as reasonably achievable. COMPARISON: Lumbar MRI 11/10/2022. CLINICAL HISTORY: Patient is a 69 year old female. Back trauma, no prior imaging. Recent body spasms. FINDINGS: Normal lumbar segmentation. BONES AND ALIGNMENT: Mild levoconvex lumbar scoliosis with apex at L2-L3, and mild dextroconvex lower lumbar scoliosis with apex at L5. Stable lordosis. No significant spondylolisthesis. Normal vertebral body heights. No acute fracture or suspicious bone lesion. Intact visible sacrum and SI joints. SOFT TISSUES: Negative lumbar paraspinal soft tissues. Mild to moderate calcified aortoiliac sclerosis. Partially visible distended urinary bladder. Negative visible non-contrast abdominal viscera. DEGENERATIVE: Lumbar spine degeneration does not appear significantly progressed from previous MRI. Chronic disc degeneration is maximal at L2-L3 and L5-S1 with vacuum disc at those levels. IMPRESSION: 1. No acute traumatic injury identified in the lumbar spine. 2. Mild scoliosis. Chronic lumbar degeneration without significant progression from 2024 MRI. 3. Aortic Atherosclerosis (ICD10-I70.0). Electronically signed by: Helayne Hurst MD 08/19/2024 09:27 AM EST RP Workstation: [id number]   CT Head Wo Contrast Result Date: 08/19/2024 EXAM: CT HEAD WITHOUT CONTRAST 08/19/2024 09:18:44 AM TECHNIQUE: CT of the head was performed without the administration of intravenous contrast. Automated exposure control, iterative reconstruction, and/or weight based adjustment of the mA/kV was utilized to reduce the radiation dose to as low as reasonably achievable. COMPARISON: CT head 08/09/2024. CLINICAL HISTORY: 69 year old female with ataxia, head trauma, and recent body spasms. FINDINGS: BRAIN AND VENTRICLES: No acute hemorrhage. No evidence of acute infarct. No  hydrocephalus. No extra-axial collection. No mass effect or midline shift. Incidental choroid plexus cysts, normal variant. Stable and normal for age brain volume, gray white differentiation. No suspicious intracranial vascular hyperdensity. Mild for age calcified atherosclerosis at the skull base. ORBITS: No acute abnormality. SINUSES: Paranasal sinuses, tympanic cavities, and mastoids remain well aerated. SOFT TISSUES AND SKULL: No acute soft tissue abnormality. No skull fracture. IMPRESSION: 1. Normal for age non-contrast head CT.  No acute traumatic injury identified. Electronically signed by: Helayne Hurst MD 08/19/2024 09:23 AM EST RP Workstation: [id number]       Elgie Butter M.D. [facility] 08/20/2024, 1:37 PM  Available via Epic secure chat 7am-7pm After 7 pm, please refer to night coverage provider listed on amion.    "

## 2024-08-20 NOTE — Progress Notes (Cosign Needed)
 Routine EEG complete. Results pending.

## 2024-08-23 ENCOUNTER — Encounter (HOSPITAL_COMMUNITY): Payer: Self-pay | Admitting: Anesthesiology

## 2024-08-23 ENCOUNTER — Encounter (HOSPITAL_COMMUNITY): Admission: EM | Payer: Self-pay | Source: Home / Self Care | Attending: Internal Medicine

## 2024-10-11 ENCOUNTER — Other Ambulatory Visit

## 2024-10-11 ENCOUNTER — Ambulatory Visit: Admitting: Oncology

## 2025-07-06 ENCOUNTER — Encounter: Admitting: Obstetrics and Gynecology
